# Patient Record
Sex: Male | Born: 1985 | Race: Asian | Hispanic: No | Marital: Married | Smoking: Former smoker
Health system: Southern US, Community
[De-identification: ages and names within clinical notes are randomized; demographics above are authoritative.]

## PROBLEM LIST (undated history)

## (undated) DIAGNOSIS — L709 Acne, unspecified: Secondary | ICD-10-CM

## (undated) HISTORY — DX: Acne, unspecified: L70.9

---

## 2009-06-12 HISTORY — PX: SOFT TISSUE CYST EXCISION: SHX2418

## 2009-07-09 ENCOUNTER — Emergency Department (HOSPITAL_COMMUNITY): Admission: EM | Admit: 2009-07-09 | Discharge: 2009-07-09 | Payer: Self-pay | Admitting: Family Medicine

## 2010-08-29 ENCOUNTER — Ambulatory Visit: Payer: Self-pay | Admitting: Family Medicine

## 2010-09-02 ENCOUNTER — Ambulatory Visit (INDEPENDENT_AMBULATORY_CARE_PROVIDER_SITE_OTHER): Payer: 59 | Admitting: Family Medicine

## 2010-09-02 DIAGNOSIS — L738 Other specified follicular disorders: Secondary | ICD-10-CM

## 2010-11-23 ENCOUNTER — Encounter: Payer: Self-pay | Admitting: Medical

## 2010-11-24 ENCOUNTER — Encounter: Payer: Self-pay | Admitting: Medical

## 2010-11-24 ENCOUNTER — Ambulatory Visit (INDEPENDENT_AMBULATORY_CARE_PROVIDER_SITE_OTHER): Payer: 59 | Admitting: Medical

## 2010-11-24 DIAGNOSIS — H1045 Other chronic allergic conjunctivitis: Secondary | ICD-10-CM

## 2010-11-24 DIAGNOSIS — L709 Acne, unspecified: Secondary | ICD-10-CM

## 2010-11-24 DIAGNOSIS — H1010 Acute atopic conjunctivitis, unspecified eye: Secondary | ICD-10-CM

## 2010-11-24 DIAGNOSIS — L910 Hypertrophic scar: Secondary | ICD-10-CM

## 2010-11-24 DIAGNOSIS — L708 Other acne: Secondary | ICD-10-CM

## 2010-11-24 DIAGNOSIS — Z0000 Encounter for general adult medical examination without abnormal findings: Secondary | ICD-10-CM

## 2010-11-24 MED ORDER — OLOPATADINE HCL 0.2 % OP SOLN: 0.2000 | Freq: Every day | OPHTHALMIC | Status: DC

## 2010-11-24 MED ORDER — CLINDAMYCIN PHOS-BENZOYL PEROX 1-5 % EX GEL: CUTANEOUS | Status: DC

## 2010-11-24 NOTE — Progress Notes (Signed)
Subjective:   HPI  Luis Watson is a 25 y.o. male who presents for complete physical.  Been in usual state of health.   Gets some exercise, works as a Network engineer at [employer], is married, has one child.  He does have a few c/o.  He is originally from [place], has lived here with his family for 2 years.  1) gets irritated, itchy and watery eyes, particularly if driving for long periods.  Hasn't tried any medication for this.   2) had a cyst or boil lanced last year at urgent care, and ever since has a hard skin area at the same place.  Wants to know about this. 3) he also has ongoing problem with acne on his back.  Had been given doxycycline in the past without relief.    No other c/o.  He notes Tdap booster through health dept last year.    History reviewed. No pertinent past medical history.  Past Surgical History  Procedure Date  . Soft tissue cyst excision 2011    chest   Family History  Problem Relation Age of Onset  . Cancer Mother     lung  . Asthma Father   . Diabetes Neg Hx   . Heart disease Neg Hx   . Hyperlipidemia Neg Hx   . Hypertension Neg Hx   . Stroke Neg Hx    No current outpatient prescriptions on file prior to visit.   No Known Allergies  Review of Systems Constitutional: +anorexia x 3-4 mo; denies fever, chills, sweats, unexpected weight change, fatigue Allergy: +itchy red eyes at times, particular if he has been driving long periods; denies recent sneezing, congestion Dermatology: +lesion on chest he is concerned about, acne on back and face.  denies changing moles, rash, lumps ENT: no runny nose, ear pain, sore throat, hoarseness, sinus pain, teeth pain, tinnitus, hearing loss, epistaxis Cardiology: denies chest pain, palpitations, edema, orthopnea, paroxysmal nocturnal dyspnea Respiratory: denies cough, shortness of breath, dyspnea on exertion, wheezing, hemoptysis Gastroenterology: denies abdominal pain, nausea, vomiting, diarrhea,  constipation, blood in stool, changes in bowel movement, dysphagia Hematology: denies bleeding or bruising problems Musculoskeletal: denies arthralgias, myalgias, joint swelling, back pain, neck pain, cramping, gait changes Ophthalmology: denies vision changes, eye redness, itching, discharge Urology: denies dysuria, difficulty urinating, hematuria, urinary frequency, urgency, incontinence Neurology: no headache, weakness, tingling, numbness, speech abnormality, memory loss, falls, dizziness Psychology: denies depressed mood, agitation, sleep problems     Objective:   Physical Exam  General appearance: alert, no distress, WD/WN, male, looks stated age Skin: back with scattered comedones and some scarring from prior comedones, upper middle chest with round flat lesion, c/w keloid, otherwise unremarkable HEENT: normocephalic, conjunctiva/corneas normal, sclerae anicteric, PERRLA, EOMi, nares patent, no discharge or erythema, pharynx normal Oral cavity: MMM, tongue normal, teeth normal Neck: supple, no lymphadenopathy, no thyromegaly, no masses, normal ROM, no bruits Chest: non tender, normal shape and expansion Heart: RRR, normal S1, S2, no murmurs Lungs: CTA bilaterally, no wheezes, rhonchi, or rales Abdomen: +bs, soft, non tender, non distended, no masses, no hepatomegaly, no splenomegaly, no bruits Back: non tender, normal ROM, no scoliosis Musculoskeletal: right volar base of thumb with linear 4cm surgical scar, upper extremities non tender, no obvious deformity, normal ROM throughout, lower extremities non tender, no obvious deformity, normal ROM throughout Extremities: no edema, no cyanosis, no clubbing Pulses: 2+ symmetric, upper and lower extremities, normal cap refill Neurological: alert, oriented x 3, CN2-12 intact, strength normal upper extremities and  lower extremities, sensation normal throughout, DTRs 2+ throughout, no cerebellar signs, gait normal Psychiatric: normal affect,  behavior normal, pleasant  GU: normal external genitalia, uncircumcised, nontender, no massses, no rash, no lymphadenopathy   Assessment :    Encounter Diagnoses  Name Primary?  . General medical examination Yes  . Keloid of skin   . Allergic conjunctivitis   . Acne      Plan:    Physical - discussed healthy lifestyle, diet, exercise, safety, preventative care.  Labs today (non fasting) and we will call with results.    Keloid - discussed this finding, gave handout on keloids.  Allergic conjunctiva - trial of Pataday.  Acne - he wants to try topical, thus, trial of gel Rx below.

## 2010-11-24 NOTE — Patient Instructions (Signed)
I will have you begin Clindamycin gel topically on your back twice daily.  Have your wife rub in on your entire back.  Wash hands after applying the gel.  I want you to try the eye drop once daily in the eyes as needed for irritated eyes.  Your skin lesion on the chest has formed a keloid.  I have provided a handout on this.    We will call you with lab results.

## 2010-11-25 LAB — TSH: TSH: 0.678 u[IU]/mL (ref 0.350–4.500)

## 2010-11-25 LAB — LIPID PANEL
LDL Cholesterol: 69 mg/dL (ref 0–99)
VLDL: 31 mg/dL (ref 0–40)

## 2010-11-25 LAB — CBC WITH DIFFERENTIAL/PLATELET
Basophils Absolute: 0 10*3/uL (ref 0.0–0.1)
Basophils Relative: 0 % (ref 0–1)
Eosinophils Relative: 2 % (ref 0–5)
HCT: 39.9 % (ref 39.0–52.0)
Hemoglobin: 13.9 g/dL (ref 13.0–17.0)
MCH: 28.7 pg (ref 26.0–34.0)
MCHC: 34.8 g/dL (ref 30.0–36.0)
MCV: 82.3 fL (ref 78.0–100.0)
Monocytes Absolute: 0.4 10*3/uL (ref 0.1–1.0)
Monocytes Relative: 8 % (ref 3–12)
RDW: 12.3 % (ref 11.5–15.5)

## 2010-11-25 LAB — COMPREHENSIVE METABOLIC PANEL
ALT: 14 U/L (ref 0–53)
AST: 19 U/L (ref 0–37)
Alkaline Phosphatase: 65 U/L (ref 39–117)
Chloride: 104 mEq/L (ref 96–112)
Creat: 1.06 mg/dL (ref 0.50–1.35)
Total Bilirubin: 0.5 mg/dL (ref 0.3–1.2)

## 2010-12-01 ENCOUNTER — Encounter: Payer: Self-pay | Admitting: *Deleted

## 2010-12-01 ENCOUNTER — Telehealth: Payer: Self-pay | Admitting: *Deleted

## 2010-12-01 ENCOUNTER — Other Ambulatory Visit: Payer: Self-pay | Admitting: *Deleted

## 2010-12-01 NOTE — Telephone Encounter (Addendum)
Message copied by Dorthula Perfect on Thu Dec 01, 2010 11:46 AM ------      Message from: Aleen Campi, DAVID S      Created: Mon Nov 28, 2010  8:00 AM       Did we get a urinalysis on him?  All his other labs are normal - liver, kidney, glucose, cholesterol, blood counts, thyroid, electrolytes.  Have him call in 45mo on his medications for dry eyes and acne and let us know if its helping.    Tried calling pt, phone disconnected.  Sent letter for pt to call to inform us how the medications are helping for dry eyes and acne.  Also sent copy of labs to pt with letter.  CM, LPN

## 2010-12-06 ENCOUNTER — Other Ambulatory Visit (INDEPENDENT_AMBULATORY_CARE_PROVIDER_SITE_OTHER): Payer: 59

## 2010-12-06 DIAGNOSIS — Z0000 Encounter for general adult medical examination without abnormal findings: Secondary | ICD-10-CM

## 2010-12-06 LAB — POCT URINALYSIS DIPSTICK
Blood, UA: NEGATIVE
Glucose, UA: NEGATIVE
Nitrite, UA: NEGATIVE
Protein, UA: NEGATIVE
Spec Grav, UA: 1.02
Urobilinogen, UA: NEGATIVE

## 2010-12-07 ENCOUNTER — Telehealth: Payer: Self-pay | Admitting: *Deleted

## 2010-12-07 NOTE — Telephone Encounter (Addendum)
Message copied by Dorthula Perfect on Wed Dec 07, 2010  9:28 AM ------      Message from: Aleen Campi, DAVID S      Created: Wed Dec 07, 2010  7:57 AM       Urine results are normal.    Pt notified of urine results.  CM, LPN

## 2011-09-06 ENCOUNTER — Encounter: Payer: 59 | Admitting: Medical

## 2011-09-18 ENCOUNTER — Encounter: Payer: 59 | Admitting: Medical

## 2011-10-06 ENCOUNTER — Ambulatory Visit (INDEPENDENT_AMBULATORY_CARE_PROVIDER_SITE_OTHER): Payer: 59 | Admitting: Medical

## 2011-10-06 ENCOUNTER — Encounter: Payer: Self-pay | Admitting: Medical

## 2011-10-06 VITALS — BP 92/60 | HR 78 | Temp 97.9°F | Resp 16 | Ht 63.0 in | Wt 116.0 lb

## 2011-10-06 DIAGNOSIS — L910 Hypertrophic scar: Secondary | ICD-10-CM

## 2011-10-06 MED ORDER — HYDROCORTISONE 2.5 % EX CREA: TOPICAL_CREAM | Freq: Two times a day (BID) | CUTANEOUS | Status: AC

## 2011-10-06 NOTE — Patient Instructions (Signed)
Epidermal Cyst An epidermal cyst is sometimes called a sebaceous cyst, epidermal inclusion cyst, or infundibular cyst. These cysts usually contain a substance that looks "pasty" or "cheesy" and may have a bad smell. This substance is a protein called keratin. Epidermal cysts are usually found on the face, neck, or trunk. They may also occur in the vaginal area or other parts of the genitalia of both men and women. Epidermal cysts are usually small, painless, slow-growing bumps or lumps that move freely under the skin. It is important not to try to pop them. This may cause an infection and lead to tenderness and swelling. CAUSES  Epidermal cysts may be caused by a deep penetrating injury to the skin or a plugged hair follicle, often associated with acne. SYMPTOMS  Epidermal cysts can become inflamed and cause:  Redness.   Tenderness.   Increased temperature of the skin over the bumps or lumps.   Grayish-white, bad smelling material that drains from the bump or lump.  DIAGNOSIS  Epidermal cysts are easily diagnosed by your caregiver during an exam. Rarely, a tissue sample (biopsy) may be taken to rule out other conditions that may resemble epidermal cysts. TREATMENT   Epidermal cysts often get better and disappear on their own. They are rarely ever cancerous.   If a cyst becomes infected, it may become inflamed and tender. This may require opening and draining the cyst. Treatment with antibiotics may be necessary. When the infection is gone, the cyst may be removed with minor surgery.   Small, inflamed cysts can often be treated with antibiotics or by injecting steroid medicines.   Sometimes, epidermal cysts become large and bothersome. If this happens, surgical removal in your caregiver's office may be necessary.  HOME CARE INSTRUCTIONS  Only take over-the-counter or prescription medicines as directed by your caregiver.   Take your antibiotics as directed. Finish them even if you start to  feel better.  SEEK MEDICAL CARE IF:   Your cyst becomes tender, red, or swollen.   Your condition is not improving or is getting worse.   You have any other questions or concerns.  MAKE SURE YOU:  Understand these instructions.   Will watch your condition.   Will get help right away if you are not doing well or get worse.  Document Released: 04/29/2004 Document Revised: 05/18/2011 Document Reviewed: 12/05/2010 [facility] Patient Information 2012 [place], [place].   Keloids Email this page to a friendShare on facebookShare on twitterBookmark & SharePrinter-friendly version  A keloids is a growth of extra scar tissue where the skin has healed after an injury.  Causes Keloids can form after skin injuries from: Acne  Burns  Chickenpox  Ear piercing  Minor scratches  Cuts from surgery or trauma  Vaccination sites The problem is more common in people ages 71 to 39, and in African Americans, Asians, and Hispanics. Keloids often run in families. Symptoms A keloid may be: Flesh-colored, red, or pink  Located over the site of a wound or injury  Lumpy (nodular) or ridged  Tender and itchy  Irritated from friction such as rubbing on clothing A keloid will tan darker than the skin around it if exposed to sun during the first year after it forms. The darker color may not go away. Exams and Tests Your doctor will look at your skin to see if you have a keloid. A skin biopsy may be done to rule out other types of skin growths (tumors). Treatment Keloids often do not need treatment.  If the keloid bothers you, the following things can be done to reduce the size: Corticosteroid injections  Freezing (cryotherapy)  Laser treatments  Radiation  Surgical removal  Silicone gel or patches Many of these treatments can cause a larger keloid scar to form. Outlook (Prognosis) Keloids usually are not harmful to your health but they may affect how you look. In some cases, they may become smaller,  flatter, and less noticeable over time. When to Contact a Medical Professional Call your health care provider if: You develop keloids and want to have them removed or reduced  You develop new symptoms

## 2011-10-06 NOTE — Progress Notes (Signed)
Subjective: He notes concern for bump on chest.  He notes in the past was seen by urgent care for cyst on chest.  This was cut open and drained.  Since then he has had a scar that is itchy.  He wants to know why he got both the cyst and the scar and why it doesn't go back to normal appearing skin.  It itches, particular if he gets hot.  No other c/o.  Objective: Gen: wd, wn Skin: upper middle chest with 1.5 x 1 cm squarish keloid  Assessment: Encounter Diagnosis  Name Primary?  . Keloid Yes    Plan hydrocortisone cream for itching, and will refer to plastic surgery if he desires.

## 2012-05-24 ENCOUNTER — Ambulatory Visit: Payer: 59 | Admitting: Medical

## 2012-05-27 ENCOUNTER — Ambulatory Visit: Payer: 59 | Admitting: Medical

## 2012-06-11 ENCOUNTER — Encounter: Payer: Self-pay | Admitting: Family Medicine

## 2012-07-31 ENCOUNTER — Ambulatory Visit
Admission: RE | Admit: 2012-07-31 | Discharge: 2012-07-31 | Disposition: A | Discharge status: Home / Self Care | Payer: 59 | Source: Ambulatory Visit | Attending: Medical | Admitting: Medical

## 2012-07-31 ENCOUNTER — Ambulatory Visit (INDEPENDENT_AMBULATORY_CARE_PROVIDER_SITE_OTHER): Payer: 59 | Admitting: Medical

## 2012-07-31 ENCOUNTER — Encounter: Payer: Self-pay | Admitting: Medical

## 2012-07-31 VITALS — BP 110/70 | HR 94 | Resp 20 | Wt 121.0 lb

## 2012-07-31 DIAGNOSIS — R079 Chest pain, unspecified: Secondary | ICD-10-CM

## 2012-07-31 MED ORDER — IBUPROFEN 600 MG PO TABS: 600.0000 mg | ORAL_TABLET | Freq: Three times a day (TID) | ORAL | Status: DC | PRN

## 2012-07-31 NOTE — Progress Notes (Signed)
  Subjective:    Luis Watson is a 27 y.o. male who presents for evaluation of chest pain. Onset was 2 days ago. Symptoms have been stable since that time. The patient describes the pain as sharp and does not radiate. Patient rates pain as a 7/10 in intensity. Associated symptoms are: none. Aggravating factors are: deep inspiration and moving right shoulder or turning torso makes it worse. Alleviating factors are: none. Patient's cardiac risk factors are: male gender. Patient's risk factors for DVT/PE: none. Previous cardiac testing: none. No prior similar.   The following portions of the patient's history were reviewed and updated as appropriate: allergies, current medications, past family history, past medical history, past social history, past surgical history and problem list.  Review of Systems Constitutional: +fever last Sunday, but none since, chills, sweats, unexpected weight change, anorexia, fatigue ENT: no runny nose, ear pain, sore throat, hoarseness, sinus pain, hearing loss, epistaxis Cardiology: denies palpitations, edema, orthopnea, paroxysmal nocturnal dyspnea Respiratory: +mild cough first thing in morning that resolves, shortness of breath, dyspnea on exertion, wheezing, hemoptysis Gastroenterology: denies abdominal pain, nausea, vomiting, diarrhea, constipation,  Hematology: denies bleeding or bruising problems Musculoskeletal: denies arthralgias, myalgias, joint swelling, back pain, neck pain, cramping, gait changes Urology: denies dysuria, difficulty urinating Neurology: no headache, weakness, tingling, numbness Psychology: denies depressed mood, agitation, sleep problems    Objective:     Filed Vitals:   07/31/12 1539  BP: 110/70  Pulse: 94  Resp: 20    General appearance: alert, no distress, WD/WN, male  HEENT: normocephalic, conjunctiva/corneas normal, sclerae anicteric, PERRLA, EOMi, nares patent, no discharge or erythema, pharynx normal Oral cavity:  MMM Neck: supple, no lymphadenopathy, no thyromegaly, no JVD, no bruits, no masses, normal ROM Chest: right superior anterior chest wall tenderness, otherwise non tender, normal shape and expansion Heart: RRR, normal S1, S2, no murmurs Lungs: CTA bilaterally, no wheezes, rhonchi, or rales, normal percussion  Abdomen: +bs, soft, non tender, non distended, no masses, no hepatomegaly, no splenomegaly, no bruits Back: non tender, normal ROM, no scoliosis Musculoskeletal: upper extremities non tender, no obvious deformity, normal ROM throughout Extremities: no edema, no cyanosis, no clubbing Pulses: 2+ symmetric, upper and lower extremities, normal cap refill   Assessment:   Encounter Diagnoses  Name Primary?  . Chest pain Yes  . Costochondritis   . Tobacco use disorder       Plan:  Pleuritic chest pain, chest wall tenderness.   Your symptoms suggest costochondritis or musculoskeletal chest pain.   I recommend you stretch you arms, shoulder, neck and upper body daily, exercise regularly such as walking or other exercise.  You can use Ibuprofen prescription 3 times daily for the next several days for chest pain.     I recommend you stop smoking due to health risks including cancer, heart disease, artery disease, and increased chance of lung infection.  Go for xray, but I really suspect this is chest wall pain and less likely anything to do with your lungs.  Gave handout on costochondritis, Ibuprofen script.

## 2012-07-31 NOTE — Patient Instructions (Addendum)
Your symptoms suggest costochondritis or musculoskeletal chest pain.   I recommend you stretch you arms, shoulder, neck and upper body daily, exercise regularly such as walking or other exercise.  You can use Ibuprofen prescription 3 times daily for the next several days for chest pain.     I recommend you stop smoking due to health risks including cancer, heart disease, artery disease, and increased chance of lung infection.  Go for xray, but I really suspect this is chest wall pain and less likely anything to do with your lungs.    Costochondritis Costochondritis (Tietze syndrome), or costochondral separation, is a swelling and irritation (inflammation) of the tissue (cartilage) that connects your ribs with your breastbone (sternum). It may occur on its own (spontaneously), through damage caused by an accident (trauma), or simply from coughing or minor exercise. It may take up to 6 weeks to get better and longer if you are unable to be conservative in your activities. HOME CARE INSTRUCTIONS   Avoid exhausting physical activity. Try not to strain your ribs during normal activity. This would include any activities using chest, belly (abdominal), and side muscles, especially if heavy weights are used.  Use ice for 15 to 20 minutes per hour while awake for the first 2 days. Place the ice in a plastic bag, and place a towel between the bag of ice and your skin.  Only take over-the-counter or prescription medicines for pain, discomfort, or fever as directed by your caregiver. SEEK IMMEDIATE MEDICAL CARE IF:   Your pain increases or you are very uncomfortable.  You have a fever.  You develop difficulty with your breathing.  You cough up blood.  You develop worse chest pains, shortness of breath, sweating, or vomiting.  You develop new, unexplained problems (symptoms). MAKE SURE YOU:   Understand these instructions.  Will watch your condition.  Will get help right away if you are not  doing well or get worse. Document Released: 03/08/2005 Document Revised: 08/21/2011 Document Reviewed: 01/15/2008 [facility] Patient Information 2013 [place], [place].

## 2012-08-01 NOTE — Progress Notes (Signed)
Quick Note:  CALLED PT ONLY NUMBER IN CHART AND LEFT MESSAGE WORD FOR WORD (   CXR is normal. Advised him to use Ibuprofen TID for the next few days. i sent this to pharmacy. If not improving in 1 wk, let me know. Recommend he stop smoking. )  ______

## 2013-04-04 ENCOUNTER — Encounter (HOSPITAL_COMMUNITY): Payer: Self-pay | Admitting: Emergency Medicine

## 2013-04-04 ENCOUNTER — Emergency Department (HOSPITAL_COMMUNITY)
Admission: EM | Admit: 2013-04-04 | Discharge: 2013-04-05 | Disposition: A | Discharge status: Home / Self Care | Payer: 59 | Attending: Emergency Medicine | Admitting: Emergency Medicine

## 2013-04-04 ENCOUNTER — Emergency Department (HOSPITAL_COMMUNITY): Payer: 59

## 2013-04-04 DIAGNOSIS — H9209 Otalgia, unspecified ear: Secondary | ICD-10-CM | POA: Insufficient documentation

## 2013-04-04 DIAGNOSIS — J029 Acute pharyngitis, unspecified: Secondary | ICD-10-CM | POA: Insufficient documentation

## 2013-04-04 DIAGNOSIS — F17200 Nicotine dependence, unspecified, uncomplicated: Secondary | ICD-10-CM | POA: Insufficient documentation

## 2013-04-04 DIAGNOSIS — M542 Cervicalgia: Secondary | ICD-10-CM | POA: Insufficient documentation

## 2013-04-04 DIAGNOSIS — Z791 Long term (current) use of non-steroidal anti-inflammatories (NSAID): Secondary | ICD-10-CM | POA: Insufficient documentation

## 2013-04-04 DIAGNOSIS — H9202 Otalgia, left ear: Secondary | ICD-10-CM

## 2013-04-04 DIAGNOSIS — R079 Chest pain, unspecified: Secondary | ICD-10-CM | POA: Insufficient documentation

## 2013-04-04 LAB — BASIC METABOLIC PANEL
BUN: 10 mg/dL (ref 6–23)
Calcium: 9.3 mg/dL (ref 8.4–10.5)
Creatinine, Ser: 0.86 mg/dL (ref 0.50–1.35)
GFR calc Af Amer: >90 mL/min (ref >90)
GFR calc non Af Amer: >90 mL/min (ref >90)
Glucose, Bld: 95 mg/dL (ref 70–99)

## 2013-04-04 LAB — CBC
HCT: 42.2 % (ref 39.0–52.0)
Hemoglobin: 15.6 g/dL (ref 13.0–17.0)
MCH: 30.2 pg (ref 26.0–34.0)
MCHC: 37 g/dL — ABNORMAL HIGH (ref 30.0–36.0)
MCV: 81.6 fL (ref 78.0–100.0)
RDW: 11.9 % (ref 11.5–15.5)

## 2013-04-04 MED ORDER — NAPROXEN 250 MG PO TABS
500.0000 mg | ORAL_TABLET | Freq: Once | ORAL | Status: AC
  Administered 2013-04-04: 500 mg via ORAL
  Filled 2013-04-04: qty 2

## 2013-04-04 MED ORDER — NAPROXEN 500 MG PO TABS: 500.0000 mg | ORAL_TABLET | Freq: Two times a day (BID) | ORAL | Status: DC

## 2013-04-04 NOTE — ED Notes (Addendum)
C/o burning in center of chest x 3 days with intermittent sob.  Also reports L ear pain and neck pain since this afternoon.  Denies nausea and vomiting.  Chest pain worse with movement.

## 2013-04-04 NOTE — ED Provider Notes (Signed)
I saw and evaluated the patient, reviewed the resident's note and I agree with the findings and plan. If applicable, I agree with the resident's interpretation of the EKG.  If applicable, I was present for critical portions of any procedures performed.  2 days of L ear pain, anterior chest pain, worse with movement.  No fever.  No meningismus.  CTAB, RRR. No exudates.  EKG nsr.   Atypical for ACS or PE.   Glynn Octave, MD 04/04/13 [phone]

## 2013-04-04 NOTE — ED Provider Notes (Signed)
CSN: [id number]     Arrival date & time 04/04/13  2129 History   First MD Initiated Contact with Patient 04/04/13 2157     Chief Complaint  Patient presents with  . Otalgia  . Chest Pain   (Consider location/radiation/quality/duration/timing/severity/associated sxs/prior Treatment) HPI Onset was yesterday for chest pain and today for left side neck pain and ear pain.  The pain is mild. Chest pain described as sharp, located to central chest worse with movement. Left side neck and ear pain described as mild, worse with swallowing.  Associated symptoms: sore throat, no hearing loss, no fever, no cough.  Recent medical care: none.   History reviewed. No pertinent past medical history. Past Surgical History  Procedure Laterality Date  . Soft tissue cyst excision  2011    chest   Family History  Problem Relation Age of Onset  . Cancer Mother     lung  . Asthma Father   . Diabetes Neg Hx   . Heart disease Neg Hx   . Hyperlipidemia Neg Hx   . Hypertension Neg Hx   . Stroke Neg Hx    History  Substance Use Topics  . Smoking status: Current Some Day Smoker -- 0.25 packs/day for 2 years  . Smokeless tobacco: Never Used  . Alcohol Use: 0.5 oz/week    1 drink(s) per week    Review of Systems Constitutional: Negative for fever.  Eyes: Negative for vision loss.  ENT: Negative for difficulty swallowing.  Cardiovascular: Negative for chest pain. Respiratory: Negative for respiratory distress.  Gastrointestinal:  Negative for vomiting.  Genitourinary: Negative for inability to void.  Musculoskeletal: Negative for gait problem.  Integumentary: Negative for rash.  Neurological: Negative for new focal weakness.     Allergies  Review of patient's allergies indicates no known allergies.  Home Medications   Current Outpatient Rx  Name  Route  Sig  Dispense  Refill  . naproxen (NAPROSYN) 500 MG tablet   Oral   Take 1 tablet (500 mg total) by mouth 2 (two) times daily.   30  tablet   0    BP 110/77  Pulse 80  Temp(Src) 98.2 F (36.8 C) (Oral)  Resp 16  Wt 128 lb 1 oz (58.089 kg)  BMI 22.69 kg/m2  SpO2 100% Physical Exam Nursing note and vitals reviewed.  Constitutional: Pt is alert and appears stated age. Eyes: No injection, no scleral icterus. HENT: Atraumatic, airway open without erythema or exudate. Bilateral TMs normal. Uvula midline.  Neck: Soft, supple, FROM.  Respiratory: No respiratory distress. Equal breathing bilaterally. Cardiovascular: Normal rate. Extremities warm and well perfused.  Chest: Anterior chest tender to palpation.  Abdomen: Soft, non-tender. MSK: Extremities are atraumatic without deformity. Skin: No rash, no wounds.   Neuro: No motor nor sensory deficit.     ED Course  Procedures (including critical care time) Labs Review Labs Reviewed  CBC - Abnormal; Notable for the following:    MCHC 37.0 (*)    All other components within normal limits  BASIC METABOLIC PANEL  POCT I-STAT TROPONIN I   Imaging Review Dg Chest 2 View  04/04/2013   CLINICAL DATA:  Chest pain, palpitation  EXAM: CHEST  2 VIEW  COMPARISON:  07/31/2012  FINDINGS: The heart size and mediastinal contours are within normal limits. Both lungs are clear. The visualized skeletal structures are unremarkable.  IMPRESSION: No active cardiopulmonary disease.   Electronically Signed   By: Esperanza Heir M.D.   On: 04/04/2013 22:04  EKG Interpretation     Ventricular Rate:  70 PR Interval:  134 QRS Duration: 80 QT Interval:  382 QTC Calculation: 412 R Axis:   85 Text Interpretation:  Normal sinus rhythm Normal ECG No previous ECGs available            MDM   1. Chest pain   2. Neck pain   3. Ear pain, left    27 y.o. male w/ PMHx of tobacco use presents w/ chest pain and left side neck, ear pain, sore throat. Chest pain c/w MSK pain. Not ACS, PE or dissection. EKG, CXR normal. Labs in triage normal. Likely viral etiology of other  symptoms. Pt looks great. Not c/w meningitis, no localized bacterial infection. No trauma. Pt given motrin, plan for pcp follow up. Counseling provided regarding diagnosis, treatment plan, follow up recommendations, and return precautions. Questions answer       I independently viewed, interpreted, and used in my medical decision making all ordered lab and imaging tests. Medical Decision Making discussed with ED attending Glynn Octave, MD      Charm Barges, MD 04/04/13 2325

## 2013-04-16 ENCOUNTER — Encounter: Payer: Self-pay | Admitting: Medical

## 2013-04-16 ENCOUNTER — Ambulatory Visit (INDEPENDENT_AMBULATORY_CARE_PROVIDER_SITE_OTHER): Payer: 59 | Admitting: Medical

## 2013-04-16 VITALS — BP 98/60 | HR 68 | Temp 98.2°F | Resp 16 | Wt 127.0 lb

## 2013-04-16 DIAGNOSIS — S61209A Unspecified open wound of unspecified finger without damage to nail, initial encounter: Secondary | ICD-10-CM

## 2013-04-16 DIAGNOSIS — B079 Viral wart, unspecified: Secondary | ICD-10-CM

## 2013-04-16 DIAGNOSIS — R05 Cough: Secondary | ICD-10-CM

## 2013-04-16 DIAGNOSIS — S61219A Laceration without foreign body of unspecified finger without damage to nail, initial encounter: Secondary | ICD-10-CM

## 2013-04-16 NOTE — Progress Notes (Signed)
Subjective:  Luis Watson is a 27 y.o. male who presents for several concerns.  He notes few day hx/o cough, mainly at night time.  Worse when lying.   Not coughing much in the day.  Doesn't feel sick.  No allergy concerns.   He does eat spicy foods.  Denies ear pain, fever, sinus pressure, sneezing, runny nose.   He does have mild sore throat, some epigastric pain.  Using nothing for symptoms.  He cut left index finger last night 6pm cutting vegetables.  Has small laceration little flap of skin.  Cleaned the wound with water.   Still bleeding though.  He c/o lesion on right thumb beside nail x 3 mo.  Cut some of it off with nail clippers, but it is staying a little tender.  Not changing, not better or worse.    No other aggravating or relieving factors.  No other c/o.  ROS as in subjective.    Objective: Filed Vitals:   04/16/13 1216  BP: 98/60  Pulse: 68  Temp: 98.2 F (36.8 C)  Resp: 16    General appearance: Alert, WD/WN, no distress                             Skin: left index finger at PIP dorsal with small 1cm flap like avulsion laceration, bleeding a little, but not gaping open.  No signs of infection,no erythema, no pus, no induration or warmth.   Right medial border of nailbed with round raised verrucal appearing 3mm lesion suggestive of wart.                            Head: no sinus tenderness                            Eyes: conjunctiva normal, corneas clear, PERRLA                            Ears: pearly TMs, external ear canals normal                          Nose: septum midline, turbinates swollen, with erythema and clear discharge             Mouth/throat: MMM, tongue normal, mild pharyngeal erythema                           Neck: supple, no adenopathy, no thyromegaly, nontender                          Heart: RRR, normal S1, S2, no murmurs                         Lungs: CTA bilaterally, no wheezes, rales, or rhonchi     Assessment: Encounter Diagnoses  Name  Primary?  . Cough Yes  . Laceration of finger, initial encounter   . Wart      Plan: Cough - likely due to GERD.  Begin samples of Nexium, cut back on spicy and acidic foods, recheck 2wk.  Laceration of finger - had patient irritated wound with tap water for several minutes.   Used steri strip to loosely close wound.  Advised he keep wound clean and dry, change dressing 1-2 times daily.  Gradually should heal.  Use caution not to reopen wound.  advised if signs of infection, return.   Last tetanus 2010 per patient.   Advised that no suture could be placed given wound >17 hours old.  Wart - discussed diagnosis, options for therapy.  Discussed risks/benefits of cryotherapy which he consents to.  Used Veruccafreeze to treat lesion.  Discussed wound care, signs of infection that would prompt recheck.  otherwise f/u 2-3wk.

## 2013-04-16 NOTE — Patient Instructions (Signed)
Cough - likely related to acid reflux.    Begin Nexium 24 hour tablet, 30-45 minutes prior to breakfast daily.   Cut back on spicy and acidic foods for now.  Don't eat within 2 hours of bedtime.  Wart - right thumb.  We used cryotherapy today to treat the wart.   If it blisters up in a few days, use OTC neosporin or triple antibiotic ointment twice daily, cover with bandaid.   The wart area should gradually slough off.   If not looking back to normal in 2-3 weeks, then you can either try OTC Compound W/wart removal or return for another treatment  Left index finger laceration.   Because the wound is over 12 hours old, we can't put a suture in there.  Thus, we put a steri strip on the wound to keep it closed.   As long as the steri strip is in place, leave this alone, and keep it covered with bandaid or gauze for the next several days.   The steri strip will wear off over the next week or so.     If either wound looks red, warm, draining pus, fever, then return right away.

## 2013-07-21 ENCOUNTER — Encounter: Payer: Self-pay | Admitting: Medical

## 2013-07-21 ENCOUNTER — Ambulatory Visit (INDEPENDENT_AMBULATORY_CARE_PROVIDER_SITE_OTHER): Payer: 59 | Admitting: Medical

## 2013-07-21 VITALS — BP 92/68 | HR 62 | Temp 98.0°F | Resp 16 | Wt 123.0 lb

## 2013-07-21 DIAGNOSIS — H6690 Otitis media, unspecified, unspecified ear: Secondary | ICD-10-CM

## 2013-07-21 DIAGNOSIS — J029 Acute pharyngitis, unspecified: Secondary | ICD-10-CM

## 2013-07-21 DIAGNOSIS — J209 Acute bronchitis, unspecified: Secondary | ICD-10-CM

## 2013-07-21 DIAGNOSIS — B079 Viral wart, unspecified: Secondary | ICD-10-CM

## 2013-07-21 MED ORDER — HYDROCODONE-HOMATROPINE 5-1.5 MG/5ML PO SYRP: 5.0000 mL | ORAL_SOLUTION | Freq: Three times a day (TID) | ORAL | Status: DC | PRN

## 2013-07-21 MED ORDER — AMOXICILLIN 875 MG PO TABS
875.0000 mg | ORAL_TABLET | Freq: Two times a day (BID) | ORAL | Status: DC
Start: 2013-07-21 — End: 2015-03-08

## 2013-07-21 NOTE — Progress Notes (Signed)
Subjective:  Luis Watson is a 28 y.o. male who presents for illness and skin lesion.  Symptoms include several days of head pressure, sore throat, ear pain, burning in chest, cough that is deep, yellow phlegm consistently, fever and body aches.  Denies SOB, wheezing, NVD.   Past history is significant for smoker.  Using Ibuprofen for symptoms.  Denies sick contacts.  No other aggravating or relieving factors.    He is here for wart of right thumb.   Saw me for this last year, and it seemed to resolve with verruca freeze but now seems to be growing back.   No other c/o.  ROS as in subjective   Objective: Filed Vitals:   07/21/13 1050  BP: 92/68  Pulse: 62  Temp: 98 F (36.7 C)  Resp: 16    General appearance: Alert, WD/WN, no distress                             Skin: warm, no rash                           Head: +mild maxillary sinus tenderness,                            Eyes: conjunctiva normal, corneas clear, PERRLA                            Ears: left TM with mild erythema, bilat flat TMs, external ear canals normal                          Nose: septum midline, turbinates swollen, with erythema and clear discharge             Mouth/throat: MMM, tongue normal, mild pharyngeal erythema                           Neck: supple, no adenopathy, no thyromegaly, nontender                          Heart: RRR, normal S1, S2, no murmurs                         Lungs: CTA bilaterally, no wheezes, rales, or rhonchi   Skin: right thumb medial nail bed border with 2mm round thicker area of skin where formerly was raised verruca lesion   Assessment and Plan:   Encounter Diagnoses  Name Primary?  . Acute bronchitis Yes  . Acute pharyngitis   . Otitis media   . Viral wart    Prescription given for Amoxicillin, Hycodan syrup prn, can use OTC Robitussin DM for congestion.  Tylenol or Ibuprofen OTC for fever and malaise.  Discussed symptomatic relief, nasal saline flush, and call or return  if worse or not improving in 2-3 days.     Skin lesion - discussed risks,benefits of treatment.  At his request, used Verruca freeze therapy to the verrucal lesion, 1 application with 45 second thaw time.  Discussed wound care, and recheck if not totally resolving.   Return if symptoms worsen or fail to improve.

## 2013-07-21 NOTE — Patient Instructions (Signed)
Begin Amoxicillin twice daily for ear and throat infection  Begin OTC Robitussin DM for cough and congestion.  At bedtime you can use Hycodan syrup for worse cough  Rest, drink plenty of water, use salt water gargles and warm fluids for throat pain  Use Ibuprofen OTC, 3 tablets 3 times daily for the next few days for throat pain and chest pain.

## 2014-11-05 ENCOUNTER — Institutional Professional Consult (permissible substitution): Payer: Self-pay | Admitting: Pulmonary Disease

## 2015-03-08 ENCOUNTER — Ambulatory Visit (INDEPENDENT_AMBULATORY_CARE_PROVIDER_SITE_OTHER): Payer: 59 | Admitting: Family Medicine

## 2015-03-08 ENCOUNTER — Encounter: Payer: Self-pay | Admitting: Family Medicine

## 2015-03-08 VITALS — BP 110/60 | HR 72 | Wt 124.0 lb

## 2015-03-08 DIAGNOSIS — L910 Hypertrophic scar: Secondary | ICD-10-CM | POA: Diagnosis not present

## 2015-03-08 DIAGNOSIS — J029 Acute pharyngitis, unspecified: Secondary | ICD-10-CM

## 2015-03-08 NOTE — Progress Notes (Signed)
   Subjective:    Patient ID: Luis Watson, male    DOB: [birth year], 29 y.o.   MRN: [id number]  HPI He has a scar on his anterior chest is causing some slight itching. He would like it removed. He also complains of a slight sore throat and left earache.   Review of Systems     Objective:   Physical Exam Alert and in no distress. Tympanic membranes and canals are normal. Pharyngeal area is normal. Neck is supple without adenopathy or thyromegaly. Cardiac exam shows a regular sinus rhythm without murmurs or gallops. Lungs are clear to auscultation. A pigmented 1 x 2.5 cm firm lesion noted on the upper anterior chest.       Assessment & Plan:  Keloid of skin  Sore throat I explained that it was a keloid and removing this could only end up causing a larger scar. I then injected a proximal we 20 mg of Kenalog in and around the keloid as it was hard to injected directly into it. hopefully this will help shrink it. Supportive care for the sore throat.

## 2015-10-12 ENCOUNTER — Encounter: Payer: Self-pay | Admitting: Medical

## 2015-10-12 ENCOUNTER — Ambulatory Visit (INDEPENDENT_AMBULATORY_CARE_PROVIDER_SITE_OTHER): Payer: BLUE CROSS/BLUE SHIELD | Admitting: Medical

## 2015-10-12 VITALS — BP 110/70 | HR 72 | Temp 97.8°F | Resp 16 | Wt 117.0 lb

## 2015-10-12 DIAGNOSIS — R059 Cough, unspecified: Secondary | ICD-10-CM

## 2015-10-12 DIAGNOSIS — H9202 Otalgia, left ear: Secondary | ICD-10-CM

## 2015-10-12 DIAGNOSIS — J988 Other specified respiratory disorders: Secondary | ICD-10-CM | POA: Diagnosis not present

## 2015-10-12 DIAGNOSIS — R05 Cough: Secondary | ICD-10-CM | POA: Diagnosis not present

## 2015-10-12 MED ORDER — AMOXICILLIN 875 MG PO TABS: 875.0000 mg | ORAL_TABLET | Freq: Two times a day (BID) | ORAL | Status: DC

## 2015-10-12 MED ORDER — PROMETHAZINE-DM 6.25-15 MG/5ML PO SYRP: 5.0000 mL | ORAL_SOLUTION | Freq: Four times a day (QID) | ORAL | Status: DC | PRN

## 2015-10-12 NOTE — Patient Instructions (Signed)
Encounter Diagnoses  Name Primary?  Marland Kitchen. Respiratory tract infection Yes  . Otalgia of left ear   . Cough     Recommendations:   Rest  Hydrate well with water  Begin Amoxicillin antibiotic twice daily for 10 days  Begin Promethazine DM for cough and congestion  You can continue Ibuprofen for pain, fever  If worse or not much improved by the end of the week, the call back

## 2015-10-12 NOTE — Progress Notes (Signed)
Subjective: Chief Complaint  Patient presents with  . Cough    producing yellow phlegm. states chest and ear hurts. started two days ago   Here for several day hx/o cough, chest burning, left ear hurting, coughing up yellow phlegm, fever, has sore throat.  No headache or sinus pain.   No NVD, but has some dizziness.   Feels some SOB.  No sick contacts.  Using some ibuprofen.   Nonsmoker.  No other aggravating or relieving factors. No other complaint.  No past medical history on file.   ROS as in subjective   Objective: BP 110/70 mmHg  Pulse 72  Temp(Src) 97.8 F (36.6 C) (Tympanic)  Resp 16  Wt 117 lb (53.071 kg)  SpO2 99%  General appearance: Alert, WD/WN, no distress                             Skin: warm, no rash                           Head: no sinus tenderness,                            Eyes: conjunctiva normal, corneas clear, PERRLA                            Ears: erythema mildly of left TM, right ear canal and TM pearly, external ear canals normal                          Nose: septum midline, turbinates swollen, with erythema and no discharge             Mouth/throat: MMM, tongue normal, mild pharyngeal erythema                           Neck: supple, no adenopathy, no thyromegaly, non tender                          Heart: RRR, normal S1, S2, no murmurs                         Lungs: few faint wheezes, but no asymmetry, no rales, or rhonchi       Assessment: Encounter Diagnoses  Name Primary?  Marland Kitchen. Respiratory tract infection Yes  . Otalgia of left ear   . Cough     Plan: advised good hydration, rest, can use Ibuprofen OTC for pain, fever, and begin medications below.  Call if worse or not improving within the next 4-5 days.    Return soon for physical  Ahmod was seen today for cough.  Diagnoses and all orders for this visit:  Respiratory tract infection  Otalgia of left ear  Cough  Other orders -     promethazine-dextromethorphan (PROMETHAZINE-DM)  6.25-15 MG/5ML syrup; Take 5 mLs by mouth 4 (four) times daily as needed for cough. -     amoxicillin (AMOXIL) 875 MG tablet; Take 1 tablet (875 mg total) by mouth 2 (two) times daily.

## 2015-11-12 ENCOUNTER — Ambulatory Visit (INDEPENDENT_AMBULATORY_CARE_PROVIDER_SITE_OTHER): Payer: BLUE CROSS/BLUE SHIELD | Admitting: Medical

## 2015-11-12 ENCOUNTER — Encounter: Payer: Self-pay | Admitting: Medical

## 2015-11-12 ENCOUNTER — Other Ambulatory Visit: Payer: Self-pay

## 2015-11-12 VITALS — BP 110/72 | HR 74 | Ht 63.25 in | Wt 118.0 lb

## 2015-11-12 DIAGNOSIS — H6690 Otitis media, unspecified, unspecified ear: Secondary | ICD-10-CM

## 2015-11-12 DIAGNOSIS — Z0000 Encounter for general adult medical examination without abnormal findings: Secondary | ICD-10-CM

## 2015-11-12 DIAGNOSIS — Z139 Encounter for screening, unspecified: Secondary | ICD-10-CM | POA: Diagnosis not present

## 2015-11-12 LAB — BASIC METABOLIC PANEL
BUN: 15 mg/dL (ref 7–25)
CALCIUM: 9.5 mg/dL (ref 8.6–10.3)
CO2: 26 mmol/L (ref 20–31)
Chloride: 102 mmol/L (ref 98–110)
Creat: 1.04 mg/dL (ref 0.60–1.35)
Glucose, Bld: 64 mg/dL — ABNORMAL LOW (ref 65–99)
Potassium: 4 mmol/L (ref 3.5–5.3)
SODIUM: 138 mmol/L (ref 135–146)

## 2015-11-12 LAB — CBC
HEMATOCRIT: 45.4 % (ref 38.5–50.0)
HEMOGLOBIN: 15.3 g/dL (ref 13.2–17.1)
MCH: 28.4 pg (ref 27.0–33.0)
MCHC: 33.7 g/dL (ref 32.0–36.0)
MCV: 84.4 fL (ref 80.0–100.0)
MPV: 11.7 fL (ref 7.5–12.5)
Platelets: 181 10*3/uL (ref 140–400)
RBC: 5.38 MIL/uL (ref 4.20–5.80)
RDW: 13.4 % (ref 11.0–15.0)
WBC: 4.6 10*3/uL (ref 4.0–10.5)

## 2015-11-12 LAB — LIPID PANEL
CHOL/HDL RATIO: 2.9 ratio (ref <=5.0)
Cholesterol: 188 mg/dL (ref 125–200)
HDL: 64 mg/dL (ref >=40)
LDL CALC: 106 mg/dL (ref <130)
Triglycerides: 90 mg/dL (ref <150)
VLDL: 18 mg/dL (ref <30)

## 2015-11-12 MED ORDER — CIPROFLOXACIN-HYDROCORTISONE 0.2-1 % OT SUSP: 3.0000 [drp] | Freq: Two times a day (BID) | OTIC | Status: DC

## 2015-11-12 NOTE — Addendum Note (Signed)
Addended by: Kieth BrightlyLAWSON, Brighten Orndoff M on: 11/12/2015 11:44 AM   Modules accepted: Orders, SmartSet

## 2015-11-12 NOTE — Progress Notes (Signed)
Subjective:   HPI  Luis Watson is a 30 y.o. male who presents for complete physical.  Been in usual state of health.   Gets some exercise, works as a Network engineer at [employer], is married, has one child.   He is originally from [place].  Has had some intermittent pain in left ear of late, has hx/o ear infection  No other c/o  Past Medical History  Diagnosis Date  . Acne     Past Surgical History  Procedure Laterality Date  . Soft tissue cyst excision  2011    chest    Social History   Social History  . Marital Status: Married    Spouse Name: N/A  . Number of Children: N/A  . Years of Education: N/A   Occupational History  . Not on file.   Social History Main Topics  . Smoking status: Former Smoker -- 0.25 packs/day for 2 years  . Smokeless tobacco: Never Used  . Alcohol Use: 0.6 oz/week    1 Standard drinks or equivalent per week     Comment: occasional  . Drug Use: No  . 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      Family History  Problem Relation Age of Onset  . Cancer Mother     lung  . Asthma Father   . Diabetes Neg Hx   . Heart disease Neg Hx   . Hyperlipidemia Neg Hx   . Hypertension Neg Hx   . Stroke Neg Hx      Current outpatient prescriptions:  .  ciprofloxacin-hydrocortisone (CIPRO HC) otic suspension, Place 3 drops into the left ear 2 (two) times daily., Disp: 10 mL, Rfl: 1  No Known Allergies    Review of Systems Constitutional: denies fever, chills, sweats, unexpected weight change, fatigue Allergy: none Dermatology: -acne on back and face.  denies changing moles, rash, lumps ENT: no runny nose, ear pain, sore throat, hoarseness, sinus pain, teeth pain, tinnitus, hearing loss,  epistaxis Cardiology: denies chest pain, palpitations, edema, orthopnea, paroxysmal nocturnal dyspnea Respiratory: denies cough, shortness of breath, dyspnea on exertion, wheezing, hemoptysis Gastroenterology: denies abdominal pain, nausea, vomiting, diarrhea, constipation, blood in stool, changes in bowel movement, dysphagia Hematology: denies bleeding or bruising problems Musculoskeletal: denies arthralgias, myalgias, joint swelling, back pain, neck pain, cramping, gait changes Ophthalmology: denies vision changes, eye redness, itching, discharge Urology: denies dysuria, difficulty urinating, hematuria, urinary frequency, urgency, incontinence Neurology: no headache, weakness, tingling, numbness, speech abnormality, memory loss, falls, dizziness Psychology: denies depressed mood, agitation, sleep problems     Objective:   Physical Exam BP 110/72 mmHg  Pulse 74  Ht 5' 3.25" (1.607 m)  Wt 118 lb (53.524 kg)  BMI 20.73 kg/m2  General appearance: alert, no distress, WD/WN, male, looks stated age Skin: back with scattered comedones and some scarring from prior comedones, upper middle chest with round flat lesion, c/w keloid, otherwise unremarkable HEENT: normocephalic, conjunctiva/corneas normal, sclerae anicteric, PERRLA, EOMi, nares patent, no discharge or erythema, pharynx normal Oral cavity: MMM, tongue normal, teeth normal Neck: supple, no lymphadenopathy, no thyromegaly, no masses, normal ROM, no bruits Chest: non tender, normal shape and expansion Heart: RRR, normal S1, S2, no murmurs Lungs:  CTA bilaterally, no wheezes, rhonchi, or rales Abdomen: +bs, soft, non tender, non distended, no masses, no hepatomegaly, no splenomegaly, no bruits Back: non tender, normal ROM, no scoliosis Musculoskeletal: right volar base of thumb with linear 4cm surgical scar, upper extremities non tender, no obvious deformity, normal ROM throughout, lower extremities non tender, no obvious deformity,  normal ROM throughout Extremities: no edema, no cyanosis, no clubbing Pulses: 2+ symmetric, upper and lower extremities, normal cap refill Neurological: alert, oriented x 3, CN2-12 intact, strength normal upper extremities and lower extremities, sensation normal throughout, DTRs 2+ throughout, no cerebellar signs, gait normal Psychiatric: normal affect, behavior normal, pleasant  GU: normal external genitalia, uncircumcised, nontender, no mass, no rash, no lymphadenopathy   Assessment :    Encounter Diagnoses  Name Primary?  . Encounter for health maintenance examination in adult Yes  . Ear infection   . Screening for condition      Plan:    Physical - discussed healthy lifestyle, diet, exercise, safety, preventative care.  Labs today He will get us copy of his immunizations He wants ABO blood type test   Ear infection - mild, script for drops today discussed testicular monthly self exams advised he see dentist for hygiene visit

## 2015-11-13 LAB — ABO AND RH: Rh Type: POSITIVE

## 2015-11-17 ENCOUNTER — Telehealth: Payer: Self-pay | Admitting: Medical

## 2015-11-17 MED ORDER — NEOMYCIN-POLYMYXIN-HC 1 % OT SOLN: 4.0000 [drp] | Freq: Three times a day (TID) | OTIC | Status: DC

## 2015-11-17 NOTE — Telephone Encounter (Signed)
Pt notified, went thru for $10

## 2015-11-17 NOTE — Telephone Encounter (Signed)
P.A. Gibson Rampenied, pt needs trial of Cortisporin otic solution or suspension ok to switch per Vincenza HewsShane.  Medication was called into pharmacy

## 2015-12-23 ENCOUNTER — Telehealth: Payer: Self-pay

## 2015-12-23 NOTE — Telephone Encounter (Signed)
Labs, OV notes placed in your folder for review. Trixie Rude/RLB

## 2015-12-27 ENCOUNTER — Encounter: Payer: Self-pay | Admitting: Medical

## 2016-03-22 ENCOUNTER — Ambulatory Visit (INDEPENDENT_AMBULATORY_CARE_PROVIDER_SITE_OTHER): Payer: BLUE CROSS/BLUE SHIELD | Admitting: Medical

## 2016-03-22 ENCOUNTER — Encounter: Payer: Self-pay | Admitting: Medical

## 2016-03-22 ENCOUNTER — Encounter: Payer: Self-pay | Admitting: *Deleted

## 2016-03-22 VITALS — BP 106/70 | HR 97 | Temp 98.2°F | Ht 63.25 in | Wt 123.0 lb

## 2016-03-22 DIAGNOSIS — H6690 Otitis media, unspecified, unspecified ear: Secondary | ICD-10-CM | POA: Diagnosis not present

## 2016-03-22 DIAGNOSIS — H9202 Otalgia, left ear: Secondary | ICD-10-CM | POA: Diagnosis not present

## 2016-03-22 DIAGNOSIS — Z23 Encounter for immunization: Secondary | ICD-10-CM | POA: Diagnosis not present

## 2016-03-22 MED ORDER — AMOXICILLIN 500 MG PO TABS: ORAL_TABLET | ORAL | 0 refills | Status: DC

## 2016-03-22 MED ORDER — PSEUDOEPHEDRINE HCL ER 120 MG PO TB12: 120.0000 mg | ORAL_TABLET | Freq: Two times a day (BID) | ORAL | 0 refills | Status: DC

## 2016-03-22 NOTE — Progress Notes (Signed)
Subjective: Chief Complaint  Patient presents with  . Ear Pain    left ear    Here for left ear discomfort.   Having left ear pain last few days, intense itching but no drainage. He did put a qtip in the other day, got slight bit of bloody drainage.   No runny nose, no sneezing, no sore throat, no cough, no fever.  No other aggravating or relieving factors. No other complaint.   Past Medical History:  Diagnosis Date  . Acne    No current outpatient prescriptions on file prior to visit.   No current facility-administered medications on file prior to visit.    ROS as in subjective   Objective: BP 106/70   Pulse 97   Temp 98.2 F (36.8 C) (Oral)   Ht 5' 3.25" (1.607 m)   Wt 123 lb (55.8 kg)   SpO2 98%   BMI 21.62 kg/m   General appearance: Alert, WD/WN, no distress, mildly ill appearing                             Skin: warm, no rash                           Head: no sinus tenderness                            Eyes: conjunctiva normal, corneas clear, PERRLA                            Ears: right TM pearly, left TM with erythema, reduced light reflex, bulging, otherwise no blood, no drainage, external ear canals normal                          Nose: septum midline, turbinates swollen, with erythema and clear discharge             Mouth/throat: MMM, tongue normal, mild pharyngeal erythema                           Neck: supple, no adenopathy, no thyromegaly, non tender                           Lungs: CTA bilaterally, no wheezes, rales, or rhonchi       Assessment: Encounter Diagnoses  Name Primary?  . Acute otitis media, unspecified otitis media type Yes  . Ear pain, left   . Need for prophylactic vaccination and inoculation against influenza     Plan: Begin medications below, hydrate well. If not completely back to normal in a week, let me know.  Of note , he has been averaging 1 ear infection a years.  discussed possible need to see ENT in the future if more  frequent recurrence.  He does have a 29yo at home  Counseled on the influenza virus vaccine.  Vaccine information sheet given.  Influenza vaccine given after consent obtained.  Luis Watson was seen today for ear pain.  Diagnoses and all orders for this visit:  Acute otitis media, unspecified otitis media type  Ear pain, left  Need for prophylactic vaccination and inoculation against influenza  Other orders -     amoxicillin (AMOXIL) 500 MG tablet; 2  tablets po BID x 10 days -     pseudoephedrine (SUDAFED 12 HOUR) 120 MG 12 hr tablet; Take 1 tablet (120 mg total) by mouth 2 (two) times daily.

## 2016-04-05 ENCOUNTER — Telehealth: Payer: Self-pay | Admitting: Medical

## 2016-04-05 NOTE — Telephone Encounter (Signed)
Pt called and states that his left ear is still hurting, he finished his antibiotic and is still having pain and clogged up, wants to know if you will send him in another antibiotic, states he is still not feeling any better pt uses [phone] - [place], [place] - [address] [facility] [address] also states that you would send him to a ENT if he did not get any better please advise

## 2016-04-06 ENCOUNTER — Other Ambulatory Visit: Payer: Self-pay | Admitting: Medical

## 2016-04-06 DIAGNOSIS — Z8669 Personal history of other diseases of the nervous system and sense organs: Secondary | ICD-10-CM

## 2016-04-06 MED ORDER — CIPROFLOXACIN-DEXAMETHASONE 0.3-0.1 % OT SUSP: 4.0000 [drp] | Freq: Two times a day (BID) | OTIC | 0 refills | Status: DC

## 2016-04-06 NOTE — Telephone Encounter (Signed)
I sent ear drop to pharmacy.   Go ahead and refer to ENT for chronic ear infection

## 2016-04-06 NOTE — Telephone Encounter (Signed)
Order entered

## 2016-04-12 ENCOUNTER — Telehealth: Payer: Self-pay | Admitting: Medical

## 2016-04-12 NOTE — Telephone Encounter (Signed)
Patient called today.  He says he still hasn't heard back from us or ENT about referral to ENT. Please check into this.  Also make sure pharmacy got the order for the ear drops send per last phone call.

## 2016-04-12 NOTE — Telephone Encounter (Signed)
error 

## 2016-04-12 NOTE — Telephone Encounter (Signed)
Called talked with [facility] ent, they said that they had tried to call him set up an appt.  His appt is for 04/25/16 @ 120pm. Give him the phone # and the address of [facility] ent . Also told him to pick up his meds from the pharmacy .

## 2016-04-13 ENCOUNTER — Telehealth: Payer: Self-pay | Admitting: Family Medicine

## 2016-04-13 NOTE — Telephone Encounter (Signed)
P.A. CIPRODEX

## 2016-04-14 MED ORDER — NEOMYCIN-POLYMYXIN-HC 3.5-10000-1 OT SOLN
4.0000 [drp] | Freq: Four times a day (QID) | OTIC | 0 refills | Status: DC
Start: 2016-04-14 — End: 2016-09-01

## 2016-04-14 NOTE — Telephone Encounter (Signed)
Insurance would not pay unless there was clinical rational for Ciprodex.  Called pharmacy & states Cortisporin & Ofloxacin are covered for $10.  Per Vincenza HewsShane switched to Cortisporin.  Called pt & informed

## 2016-09-01 ENCOUNTER — Ambulatory Visit (INDEPENDENT_AMBULATORY_CARE_PROVIDER_SITE_OTHER): Payer: BLUE CROSS/BLUE SHIELD | Admitting: Medical

## 2016-09-01 ENCOUNTER — Encounter: Payer: Self-pay | Admitting: Medical

## 2016-09-01 VITALS — BP 102/68 | HR 79 | Temp 98.5°F | Wt 123.0 lb

## 2016-09-01 DIAGNOSIS — J069 Acute upper respiratory infection, unspecified: Secondary | ICD-10-CM

## 2016-09-01 DIAGNOSIS — R05 Cough: Secondary | ICD-10-CM

## 2016-09-01 DIAGNOSIS — R059 Cough, unspecified: Secondary | ICD-10-CM

## 2016-09-01 DIAGNOSIS — H663X9 Other chronic suppurative otitis media, unspecified ear: Secondary | ICD-10-CM | POA: Diagnosis not present

## 2016-09-01 MED ORDER — HYDROCODONE-HOMATROPINE 5-1.5 MG/5ML PO SYRP: 5.0000 mL | ORAL_SOLUTION | Freq: Three times a day (TID) | ORAL | 0 refills | Status: DC | PRN

## 2016-09-01 MED ORDER — CIPROFLOXACIN-HYDROCORTISONE 0.2-1 % OT SUSP: 3.0000 [drp] | Freq: Two times a day (BID) | OTIC | 1 refills | Status: DC

## 2016-09-01 MED ORDER — AMOXICILLIN 500 MG PO TABS: ORAL_TABLET | ORAL | 0 refills | Status: DC

## 2016-09-01 NOTE — Progress Notes (Signed)
Subjective: Chief Complaint  Patient presents with  . congestion ,ear aches.    coughing, runny nose , earaches, no fever    Here for left ear discomfort.   Having left ear pain last few days, cough, runny nose, chest discomfort, yellow productive mucous.   No fever, no NVD, no sore throat, no wheezing, no SOB.  No sick contacts.  Headed out of town to drive with family to [place].  Wants to have medication just in case.  He has chronic OM.  Saw ENT recently was given ear drop.  He is out of drops currently.  No other aggravating or relieving factors. No other complaint.   Past Medical History:  Diagnosis Date  . Acne    No current outpatient prescriptions on file prior to visit.   No current facility-administered medications on file prior to visit.    Review of Systems Constitutional: -fever, -chills, -sweats, -unexpected weight change,-fatigue Cardiology:  -chest pain, -palpitations, -edema Gastroenterology: -abdominal pain, -nausea, -vomiting, -diarrhea, -constipation Hematology: -bleeding or bruising problems Musculoskeletal: -arthralgias, -myalgias, -joint swelling, -back pain Ophthalmology: -vision changes, drainage Urology: -dysuria, -difficulty urinating, -hematuria, -urinary frequency, -urgency Neurology: -headache, -weakness, -tingling, -numbness    Objective: BP 102/68   Pulse 79   Temp 98.5 F (36.9 C)   Wt 123 lb (55.8 kg)   SpO2 99%   BMI 21.62 kg/m   General appearance: Alert, WD/WN, no distress                             Skin: warm, no rash                           Head: no sinus tenderness                            Eyes: conjunctiva normal, corneas clear, PERRLA                            Ears: right TM pearly, left TM with erythema, reduced light reflex, otherwise no blood, no drainage, external ear canals normal                          Nose: septum midline, turbinates swollen, with erythema and clear discharge             Mouth/throat: MMM, tongue  normal, mild pharyngeal erythema                           Neck: supple, no adenopathy, no thyromegaly, non tender                           Lungs: CTA bilaterally, no wheezes, rales, or rhonchi       Assessment: Encounter Diagnoses  Name Primary?  Marland Kitchen. Upper respiratory tract infection, unspecified type Yes  . Chronic suppurative otitis media, unspecified laterality, unspecified otitis media location   . Cough     Plan: Advised he use Robitussin DM, rest, hydrate well, begin Cipro HC drops.   Can use Hycodan prn.   If worse or not improving in the next several days, then can begin oral amoxicillin.   Call/return if worse or not improving.   Luis Watson was seen today for  congestion ,ear aches..  Diagnoses and all orders for this visit:  Upper respiratory tract infection, unspecified type  Chronic suppurative otitis media, unspecified laterality, unspecified otitis media location  Cough  Other orders -     amoxicillin (AMOXIL) 500 MG tablet; 2 tablets po BID x 10 days -     ciprofloxacin-hydrocortisone (CIPRO HC) otic suspension; Place 3 drops into the left ear 2 (two) times daily. -     HYDROcodone-homatropine (HYCODAN) 5-1.5 MG/5ML syrup; Take 5 mLs by mouth every 8 (eight) hours as needed for cough.

## 2016-09-01 NOTE — Patient Instructions (Signed)
Consider using daily Zyrtec or other allergy medication OTC at bedtime for the next 2-3 weeks OR you can use OTC Robitussin DM for cough/congestion/ear pressure  If your symptoms worsen in the next few days you can use the Amoxicillin  I refilled the ear drop for worse ear pain in the next few weeks  For worse cough, I prescribed Hycodan syrup.  Don't use this at the same time as Robitussin DM.   The Hycodan syrup may cause drowsiness.  So don't drive within 3-4 hours of taking the Hycodan

## 2016-09-09 ENCOUNTER — Telehealth: Payer: Self-pay | Admitting: Medical

## 2016-09-09 NOTE — Telephone Encounter (Signed)
P,A, Cipro HC, called pharmacy & cost is $500 & insurance will not cover.  The covered alternative is Cortisporin.   Do you want to switch?  Please sed to [facility]

## 2016-09-11 ENCOUNTER — Other Ambulatory Visit: Payer: Self-pay | Admitting: Family Medicine

## 2016-09-11 MED ORDER — AMOXICILLIN-POT CLAVULANATE 875-125 MG PO TABS
1.0000 | ORAL_TABLET | Freq: Two times a day (BID) | ORAL | 0 refills | Status: DC
Start: 2016-09-11 — End: 2016-12-27

## 2016-09-11 NOTE — Telephone Encounter (Signed)
Tell him switching him to an oral antibiotic.

## 2016-09-11 NOTE — Telephone Encounter (Signed)
Called pt informed

## 2016-12-27 ENCOUNTER — Encounter: Payer: Self-pay | Admitting: Medical

## 2016-12-27 ENCOUNTER — Ambulatory Visit (INDEPENDENT_AMBULATORY_CARE_PROVIDER_SITE_OTHER): Payer: Medicaid Other | Admitting: Medical

## 2016-12-27 VITALS — BP 98/64 | HR 81 | Wt 124.0 lb

## 2016-12-27 DIAGNOSIS — G4489 Other headache syndrome: Secondary | ICD-10-CM | POA: Diagnosis not present

## 2016-12-27 DIAGNOSIS — J0100 Acute maxillary sinusitis, unspecified: Secondary | ICD-10-CM | POA: Diagnosis not present

## 2016-12-27 MED ORDER — AMOXICILLIN 875 MG PO TABS: 875.0000 mg | ORAL_TABLET | Freq: Two times a day (BID) | ORAL | 0 refills | Status: DC

## 2016-12-27 MED ORDER — PHENYLEPHRINE-APAP-GUAIFENESIN 5-325-200 MG PO TABS: 1.0000 | ORAL_TABLET | Freq: Two times a day (BID) | ORAL | 0 refills | Status: DC

## 2016-12-27 NOTE — Progress Notes (Signed)
Subjective: Chief Complaint  Patient presents with  . headaches    headaches, hurts behind his eye on the right. started last sunday    Here for headache behind left eye.  Started yesterday one sided.   Had bloody mucous from left nostril yesterday, sore throat last night.  Felt similarly a week ago.   no sick contacts.  Felt feverish last week.    No cough, no NVD, no paresthesias, no vision or hearing changes.  No recent frequency headaches, no hx/o headache problems in general.  No other aggravating or relieving factors. No other complaint.    Past Medical History:  Diagnosis Date  . Acne    No current outpatient prescriptions on file prior to visit.   No current facility-administered medications on file prior to visit.     ROS as in subjective   Objective: BP 98/64   Pulse 81   Wt 124 lb (56.2 kg)   SpO2 96%   BMI 21.79 kg/m   General appearance: alert, no distress, WD/WN,  HEENT: normocephalic, sclerae anicteric,tender over left maxillary sinus, otherwise TMs pearly, nares patent, no discharge or erythema, pharynx normal Oral cavity: MMM, no lesions Neck: supple, no lymphadenopathy, no thyromegaly, no masses Heart: RRR, normal S1, S2, no murmurs Lungs: CTA bilaterally, no wheezes, rhonchi, or rales Neuro: non focal exam Pulses: 2+ symmetric, upper and lower extremities, normal cap refill   Assessment: Encounter Diagnoses  Name Primary?  . Other headache syndrome Yes  . Acute maxillary sinusitis, recurrence not specified     Plan: Symptoms suggest possible sinusitis.  Begin Sudafed PE, hydrate well, rest.  If worse or not improving by weekend, then begin amoxacillin.  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    Luis Watson was seen today for headaches.  Diagnoses and all orders for this visit:  Other headache syndrome  Acute maxillary sinusitis, recurrence not specified  Other orders -      Phenylephrine-APAP-Guaifenesin (SUDAFED PE PRESSURE+PAIN+MUCUS) 5-325-200 MG TABS; Take 1 tablet by mouth 2 (two) times daily. -     amoxicillin (AMOXIL) 875 MG tablet; Take 1 tablet (875 mg total) by mouth 2 (two) times daily.

## 2016-12-27 NOTE — Patient Instructions (Signed)
Recommendations: Begin Sudafed PE as below   If not improving in next 4-5 days then begin amoxicillin  Rest, hydrate well with water

## 2017-08-26 ENCOUNTER — Other Ambulatory Visit: Payer: Self-pay

## 2017-08-26 ENCOUNTER — Encounter (HOSPITAL_COMMUNITY): Payer: Self-pay | Admitting: Emergency Medicine

## 2017-08-26 ENCOUNTER — Emergency Department (HOSPITAL_COMMUNITY)
Admission: EM | Admit: 2017-08-26 | Discharge: 2017-08-26 | Disposition: A | Discharge status: Home / Self Care | Payer: Medicaid Other | Attending: Emergency Medicine | Admitting: Emergency Medicine

## 2017-08-26 DIAGNOSIS — Y939 Activity, unspecified: Secondary | ICD-10-CM | POA: Insufficient documentation

## 2017-08-26 DIAGNOSIS — Z23 Encounter for immunization: Secondary | ICD-10-CM | POA: Insufficient documentation

## 2017-08-26 DIAGNOSIS — Y999 Unspecified external cause status: Secondary | ICD-10-CM | POA: Diagnosis not present

## 2017-08-26 DIAGNOSIS — S80812A Abrasion, left lower leg, initial encounter: Secondary | ICD-10-CM | POA: Insufficient documentation

## 2017-08-26 DIAGNOSIS — Y929 Unspecified place or not applicable: Secondary | ICD-10-CM | POA: Diagnosis not present

## 2017-08-26 DIAGNOSIS — W540XXA Bitten by dog, initial encounter: Secondary | ICD-10-CM | POA: Insufficient documentation

## 2017-08-26 DIAGNOSIS — Z87891 Personal history of nicotine dependence: Secondary | ICD-10-CM | POA: Insufficient documentation

## 2017-08-26 MED ORDER — TETANUS-DIPHTH-ACELL PERTUSSIS 5-2.5-18.5 LF-MCG/0.5 IM SUSP
0.5000 mL | Freq: Once | INTRAMUSCULAR | Status: AC
  Administered 2017-08-26: 0.5 mL via INTRAMUSCULAR
  Filled 2017-08-26: qty 0.5

## 2017-08-26 MED ORDER — AMOXICILLIN-POT CLAVULANATE 875-125 MG PO TABS: 1.0000 | ORAL_TABLET | Freq: Two times a day (BID) | ORAL | 0 refills | Status: DC

## 2017-08-26 NOTE — ED Provider Notes (Signed)
[facility]-EMERGENCY DEPT Provider Note   CSN: [id number] Arrival date & time: 08/26/17  2045     History   Chief Complaint Chief Complaint  Patient presents with  . Animal Bite    HPI Luis Watson is a 32 y.o. male who presents the emergency department today for dog bite.  Patient states that 7:30 PM this afternoon he was bitten by his neighbor's dog on the left lower leg.  He is noted to have superficial abrasions to this area.  He denies any pain at this current time.  No constricted range of motion.  No active bleeding.  He is unsure of the dog's vaccination status.  He is not up-to-date on his tetanus status.  HPI  Past Medical History:  Diagnosis Date  . Acne     There are no active problems to display for this patient.   Past Surgical History:  Procedure Laterality Date  . SOFT TISSUE CYST EXCISION  2011   chest       Home Medications    Prior to Admission medications   Not on File    Family History Family History  Problem Relation Age of Onset  . Cancer Mother        lung  . Asthma Father   . Diabetes Neg Hx   . Heart disease Neg Hx   . Hyperlipidemia Neg Hx   . Hypertension Neg Hx   . Stroke Neg Hx     Social History Social History   Tobacco Use  . Smoking status: Former Smoker    Packs/day: 0.25    Years: 2.00    Pack years: 0.50  . Smokeless tobacco: Never Used  Substance Use Topics  . Alcohol use: Yes    Alcohol/week: 0.6 oz    Types: 1 Standard drinks or equivalent per week    Comment: occasional  . Drug use: No     Allergies   Patient has no known allergies.   Review of Systems Review of Systems  All other systems reviewed and are negative.    Physical Exam Updated Vital Signs BP 121/83 (BP Location: Left Arm)   Pulse 62   Temp 98.3 F (36.8 C) (Oral)   Resp 18   Ht 5\' 5"  (1.651 m)   Wt 58.1 kg (128 lb)   SpO2 99%   BMI 21.30 kg/m   Physical Exam  Constitutional: He appears  well-developed and well-nourished.  HENT:  Head: Normocephalic and atraumatic.  Right Ear: External ear normal.  Left Ear: External ear normal.  Eyes: Conjunctivae are normal. Right eye exhibits no discharge. Left eye exhibits no discharge. No scleral icterus.  Cardiovascular:  Pulses:      Dorsalis pedis pulses are 2+ on the left side.       Posterior tibial pulses are 2+ on the left side.  Pulmonary/Chest: Effort normal. No respiratory distress.  Musculoskeletal:  Lower extremity compartments soft.  Active range of motion of all joints above and below injury intact.  Neurological: He is alert. He has normal strength. No sensory deficit.  Intact sensation to the left lower extremity.  Skin: Skin is warm and dry. Capillary refill takes less than 2 seconds. No pallor.  The patient has a superficial abrasion to the left upper lateral calf that measures 1.5cm. There is no puncture wounds or skin tearing. No laceration. No active bleeding.   Psychiatric: He has a normal mood and affect.  Nursing note and vitals reviewed.  ED Treatments / Results  Labs (all labs ordered are listed, but only abnormal results are displayed) Labs Reviewed - No data to display  EKG  EKG Interpretation None       Radiology No results found.  Procedures Procedures (including critical care time)  Medications Ordered in ED Medications  Tdap (BOOSTRIX) injection 0.5 mL (not administered)     Initial Impression / Assessment and Plan / ED Course  I have reviewed the triage vital signs and the nursing notes.  Pertinent labs & imaging results that were available during my care of the patient were reviewed by me and considered in my medical decision making (see chart for details).     32 y.o. male presenting after being bit by a dog in his left lower extremity.  At presentation he was unsure if the dog's vaccination status but has contacted the owner and reports that the dog is up-to-date on  rabies vaccination.  Exam is with mild superficial abrasion to the left upper extremity.  No puncture wound or tearing of the skin.  No laceration.  Patient's tetanus updated in the department.  Will discharge home on Augmentin.  Patient is to follow with primary care doctor.  Return precautions discussed.  Patient appears safe for discharge.  Final Clinical Impressions(s) / ED Diagnoses   Final diagnoses:  Dog bite, initial encounter    ED Discharge Orders        Ordered    amoxicillin-clavulanate (AUGMENTIN) 875-125 MG tablet  Every 12 hours     08/26/17 2252       Princella Pellegrini 08/26/17 2252    Maia Plan, MD 08/27/17 [phone]

## 2017-08-26 NOTE — ED Triage Notes (Signed)
Patient was bit by neighbors dog on the left lower leg.

## 2017-08-26 NOTE — Discharge Instructions (Signed)
You were seen here today for animal bite. Please take all of your antibiotics until finished!   You may develop abdominal discomfort or diarrhea from the antibiotic.  You may help offset this with probiotics which you can buy or get in yogurt. Do not eat or take the probiotics until 2 hours after your antibiotic. Do not take your medicine if develop an itchy rash, swelling in your mouth or lips, or difficulty breathing.  Please return if you have redness & warmth around the site, streaking of redness up or down your leg, drainage from the wound or run a fever of > 100.4.

## 2017-08-29 ENCOUNTER — Ambulatory Visit: Payer: Medicaid Other | Admitting: Medical

## 2017-08-29 ENCOUNTER — Encounter: Payer: Self-pay | Admitting: Medical

## 2017-08-29 VITALS — BP 110/82 | HR 79 | Temp 98.2°F | Ht 65.0 in | Wt 127.2 lb

## 2017-08-29 DIAGNOSIS — K649 Unspecified hemorrhoids: Secondary | ICD-10-CM | POA: Diagnosis not present

## 2017-08-29 DIAGNOSIS — W540XXD Bitten by dog, subsequent encounter: Secondary | ICD-10-CM

## 2017-08-29 MED ORDER — HYDROCORTISONE 2.5 % RE CREA: 1.0000 "application " | TOPICAL_CREAM | Freq: Two times a day (BID) | RECTAL | 0 refills | Status: DC

## 2017-08-29 NOTE — Progress Notes (Signed)
Subjective: Chief Complaint  Patient presents with  . Follow-up    no concerns, just would like it to check it out   Went to the ED on 08/26/17 for dog bite of left lower leg.  Here for f/u.   He was walking by a neighbors yard when their [place] bit him in the left leg.   He found out that the dog was up to date on vaccines. He was seen in the ED, given updated tetanus, put on Augmentin which he is taking.      He notes a "pile" or hemorrhoid in anus region intermittent the past month.  Never use to get these.  He does sit on toilet a long time, but no bloating , no gas.   Doesn't drink a lot of water.r  No other aggravating or relieving factors. No other complaint.  Past Medical History:  Diagnosis Date  . Acne    Current Outpatient Medications on File Prior to Visit  Medication Sig Dispense Refill  . amoxicillin-clavulanate (AUGMENTIN) 875-125 MG tablet Take 1 tablet by mouth every 12 (twelve) hours. 14 tablet 0   No current facility-administered medications on file prior to visit.    ROS as in subjective   Objective: BP 110/82 (BP Location: Right Arm, Patient Position: Sitting, Cuff Size: Normal)   Pulse 79   Temp 98.2 F (36.8 C) (Oral)   Ht 5\' 5"  (1.651 m)   Wt 127 lb 3.2 oz (57.7 kg)   SpO2 97%   BMI 21.17 kg/m   Gen: wd, wn, nad Skin: left lateral calve region with localized 2cm diameter area of bruising and few superficial abrasions.  Slight induration but no warmth or fluctuance.   Otherwise leg neurovascularly intact DRE - declined exam   Assessment: Encounter Diagnoses  Name Primary?  . Dog bite, subsequent encounter Yes  . Hemorrhoids, unspecified hemorrhoid type      Plan: Reviewed 08/26/17 ED report.  He is taking Augmentin, up to date on Td vaccine, wound looks fine, healing normally.   Advised he finish Augment, discussed hygiene, f/u prn if any signs of infection.  Hemorrhoids - discussed diagnosis, treatment, can use cream below prn short term,  hot soaks/Sitz baths.  Increase water and fiber intake, avoid straining for long time on toilet.  F/u prn.   Dantrell was seen today for follow-up.  Diagnoses and all orders for this visit:  Dog bite, subsequent encounter  Hemorrhoids, unspecified hemorrhoid type  Other orders -     hydrocortisone (ANUSOL-HC) 2.5 % rectal cream; Place 1 application rectally 2 (two) times daily.

## 2017-08-29 NOTE — Patient Instructions (Signed)
Hemorrhoids Hemorrhoids are swollen veins in and around the rectum or anus. There are two types of hemorrhoids:  Internal hemorrhoids. These occur in the veins that are just inside the rectum. They may poke through to the outside and become irritated and painful.  External hemorrhoids. These occur in the veins that are outside of the anus and can be felt as a painful swelling or hard lump near the anus.  Most hemorrhoids do not cause serious problems, and they can be managed with home treatments such as diet and lifestyle changes. If home treatments do not help your symptoms, procedures can be done to shrink or remove the hemorrhoids. What are the causes? This condition is caused by increased pressure in the anal area. This pressure may result from various things, including:  Constipation.  Straining to have a bowel movement.  Diarrhea.  Pregnancy.  Obesity.  Sitting for long periods of time.  Heavy lifting or other activity that causes you to strain.  Anal sex.  What are the signs or symptoms? Symptoms of this condition include:  Pain.  Anal itching or irritation.  Rectal bleeding.  Leakage of stool (feces).  Anal swelling.  One or more lumps around the anus.  How is this diagnosed? This condition can often be diagnosed through a visual exam. Other exams or tests may also be done, such as:  Examination of the rectal area with a gloved hand (digital rectal exam).  Examination of the anal canal using a small tube (anoscope).  A blood test, if you have lost a significant amount of blood.  A test to look inside the colon (sigmoidoscopy or colonoscopy).  How is this treated? This condition can usually be treated at home. However, various procedures may be done if dietary changes, lifestyle changes, and other home treatments do not help your symptoms. These procedures can help make the hemorrhoids smaller or remove them completely. Some of these procedures involve  surgery, and others do not. Common procedures include:  Rubber band ligation. Rubber bands are placed at the base of the hemorrhoids to cut off the blood supply to them.  Sclerotherapy. Medicine is injected into the hemorrhoids to shrink them.  Infrared coagulation. A type of light energy is used to get rid of the hemorrhoids.  Hemorrhoidectomy surgery. The hemorrhoids are surgically removed, and the veins that supply them are tied off.  Stapled hemorrhoidopexy surgery. A circular stapling device is used to remove the hemorrhoids and use staples to cut off the blood supply to them.  Follow these instructions at home: Eating and drinking  Eat foods that have a lot of fiber in them, such as whole grains, beans, nuts, fruits, and vegetables. Ask your health care provider about taking products that have added fiber (fiber supplements).  Drink enough fluid to keep your urine clear or pale yellow. Managing pain and swelling  Take warm sitz baths for 20 minutes, 3-4 times a day to ease pain and discomfort.  If directed, apply ice to the affected area. Using ice packs between sitz baths may be helpful. ? Put ice in a plastic bag. ? Place a towel between your skin and the bag. ? Leave the ice on for 20 minutes, 2-3 times a day. General instructions  Take over-the-counter and prescription medicines only as told by your health care provider.  Use medicated creams or suppositories as told.  Exercise regularly.  Go to the bathroom when you have the urge to have a bowel movement. Do not wait.    Avoid straining to have bowel movements.  Keep the anal area dry and clean. Use wet toilet paper or moist towelettes after a bowel movement.  Do not sit on the toilet for long periods of time. This increases blood pooling and pain. Contact a health care provider if:  You have increasing pain and swelling that are not controlled by treatment or medicine.  You have uncontrolled bleeding.  You  have difficulty having a bowel movement, or you are unable to have a bowel movement.  You have pain or inflammation outside the area of the hemorrhoids. This information is not intended to replace advice given to you by your health care provider. Make sure you discuss any questions you have with your health care provider. Document Released: 05/26/2000 Document Revised: 10/27/2015 Document Reviewed: 02/10/2015 [employer] Interactive Patient Education  2018 [employer]  

## 2017-09-13 ENCOUNTER — Ambulatory Visit: Payer: Medicaid Other | Admitting: Medical

## 2017-09-13 ENCOUNTER — Encounter: Payer: Self-pay | Admitting: Medical

## 2017-09-13 VITALS — BP 118/80 | HR 71 | Ht 64.5 in | Wt 129.0 lb

## 2017-09-13 DIAGNOSIS — I8393 Asymptomatic varicose veins of bilateral lower extremities: Secondary | ICD-10-CM

## 2017-09-13 DIAGNOSIS — Z139 Encounter for screening, unspecified: Secondary | ICD-10-CM

## 2017-09-13 DIAGNOSIS — Z0000 Encounter for general adult medical examination without abnormal findings: Secondary | ICD-10-CM | POA: Diagnosis not present

## 2017-09-13 DIAGNOSIS — Z7189 Other specified counseling: Secondary | ICD-10-CM | POA: Diagnosis not present

## 2017-09-13 DIAGNOSIS — K649 Unspecified hemorrhoids: Secondary | ICD-10-CM

## 2017-09-13 DIAGNOSIS — Z7185 Encounter for immunization safety counseling: Secondary | ICD-10-CM | POA: Insufficient documentation

## 2017-09-13 MED ORDER — HYDROCORTISONE 2.5 % RE CREA: 1.0000 "application " | TOPICAL_CREAM | Freq: Two times a day (BID) | RECTAL | 2 refills | Status: DC

## 2017-09-13 NOTE — Patient Instructions (Addendum)
Thanks for trusting [place] with your health care and for coming in for a physical today.  Below are some general recommendations I have for you:  Yearly screenings See your eye doctor yearly for routine vision care. See your dentist yearly for routine dental care including hygiene visits twice yearly. See me here yearly for a routine physical and preventative care visit   Specific Concerns today:  . Consider using daily lotion for dry skin . See specific handouts below about varicose veins and hemorrhoids   Eye doctors: Dr. Glenford Peers [address] [place] [place], [place] [address] [phone]   [facility] Dr. Gelene Mink [address] [place] [place], [place] [address]  [phone] [email or web address]   Vincenza Hews, M.D. Susanne Greenhouse, O.D. [address] [place], [place] [address] Medical telephone: [phone] Optical telephone: [phone]   Please follow up yearly for a physical.   Hemorrhoids:  I prescribed Hydrocortisone 2.5% cream today.  You can apply this topically to the external hemorrhoid twice daily short term for 3-5 days at a time as needed for itching, irritations, or swelling  Please review the information below  If not improving in the next few days or worsening, then call or recheck.   Hemorrhoids Hemorrhoids are swollen veins in and around the rectum or anus. There are two types of hemorrhoids:  Internal hemorrhoids. These occur in the veins that are just inside the rectum. They may poke through to the outside and become irritated and painful.  External hemorrhoids. These occur in the veins that are outside of the anus and can be felt as a painful swelling or hard lump near the anus.  Most hemorrhoids do not cause serious problems, and they can be managed with home treatments such as diet and lifestyle changes. If home treatments do not help your symptoms, procedures can be done to shrink or remove the  hemorrhoids. What are the causes? This condition is caused by increased pressure in the anal area. This pressure may result from various things, including:  Constipation.  Straining to have a bowel movement.  Diarrhea.  Pregnancy.  Obesity.  Sitting for long periods of time.  Heavy lifting or other activity that causes you to strain.  Anal sex.  What are the signs or symptoms? Symptoms of this condition include:  Pain.  Anal itching or irritation.  Rectal bleeding.  Leakage of stool (feces).  Anal swelling.  One or more lumps around the anus.  How is this diagnosed? This condition can often be diagnosed through a visual exam. Other exams or tests may also be done, such as:  Examination of the rectal area with a gloved hand (digital rectal exam).  Examination of the anal canal using a small tube (anoscope).  A blood test, if you have lost a significant amount of blood.  A test to look inside the colon (sigmoidoscopy or colonoscopy).  How is this treated? This condition can usually be treated at home. However, various procedures may be done if dietary changes, lifestyle changes, and other home treatments do not help your symptoms. These procedures can help make the hemorrhoids smaller or remove them completely. Some of these procedures involve surgery, and others do not. Common procedures include:  Rubber band ligation. Rubber bands are placed at the base of the hemorrhoids to cut off the blood supply to them.  Sclerotherapy. Medicine is injected into the hemorrhoids to shrink them.  Infrared coagulation. A type of light energy  is used to get rid of the hemorrhoids.  Hemorrhoidectomy surgery. The hemorrhoids are surgically removed, and the veins that supply them are tied off.  Stapled hemorrhoidopexy surgery. A circular stapling device is used to remove the hemorrhoids and use staples to cut off the blood supply to them.  Follow these instructions at  home: Eating and drinking  Eat foods that have a lot of fiber in them, such as whole grains, beans, nuts, fruits, and vegetables. Ask your health care provider about taking products that have added fiber (fiber supplements).  Drink enough fluid to keep your urine clear or pale yellow. Managing pain and swelling  Take warm sitz baths for 20 minutes, 3-4 times a day to ease pain and discomfort.  If directed, apply ice to the affected area. Using ice packs between sitz baths may be helpful. ? Put ice in a plastic bag. ? Place a towel between your skin and the bag. ? Leave the ice on for 20 minutes, 2-3 times a day. General instructions  Take over-the-counter and prescription medicines only as told by your health care provider.  Use medicated creams or suppositories as told.  Exercise regularly.  Go to the bathroom when you have the urge to have a bowel movement. Do not wait.  Avoid straining to have bowel movements.  Keep the anal area dry and clean. Use wet toilet paper or moist towelettes after a bowel movement.  Do not sit on the toilet for long periods of time. This increases blood pooling and pain. Contact a health care provider if:  You have increasing pain and swelling that are not controlled by treatment or medicine.  You have uncontrolled bleeding.  You have difficulty having a bowel movement, or you are unable to have a bowel movement.  You have pain or inflammation outside the area of the hemorrhoids. This information is not intended to replace advice given to you by your health care provider. Make sure you discuss any questions you have with your health care provider. Document Released: 05/26/2000 Document Revised: 10/27/2015 Document Reviewed: 02/10/2015 Elsevier Interactive Patient Education  2018 [employer]      Varicose Veins Varicose veins are veins that have become enlarged and twisted. They are usually seen in the legs but can occur in other parts of  the body as well. What are the causes? This condition is the result of valves in the veins not working properly. Valves in the veins help to return blood from the leg to the heart. If these valves are damaged, blood flows backward and backs up into the veins in the leg near the skin. This causes the veins to become larger. What increases the risk? People who are on their feet a lot, who are pregnant, or who are overweight are more likely to develop varicose veins. What are the signs or symptoms?  Bulging, twisted-appearing, bluish veins, most commonly found on the legs.  Leg pain or a feeling of heaviness. These symptoms may be worse at the end of the day.  Leg swelling.  Changes in skin color. How is this diagnosed? A health care provider can usually diagnose varicose veins by examining your legs. Your health care provider may also recommend an ultrasound of your leg veins. How is this treated? Most varicose veins can be treated at home.However, other treatments are available for people who have persistent symptoms or want to improve the cosmetic appearance of the varicose veins. These treatment options include:  Sclerotherapy. A solution is  injected into the vein to close it off.  Laser treatment. A laser is used to heat the vein to close it off.  Radiofrequency vein ablation. An electrical current produced by radio waves is used to close off the vein.  Phlebectomy. The vein is surgically removed through small incisions made over the varicose vein.  Vein ligation and stripping. The vein is surgically removed through incisions made over the varicose vein after the vein has been tied (ligated).  Follow these instructions at home:  Do not stand or sit in one position for long periods of time. Do not sit with your legs crossed. Rest with your legs raised during the day.  Wear compression stockings as directed by your health care provider. These stockings help to prevent blood clots  and reduce swelling in your legs.  Do not wear other tight, encircling garments around your legs, pelvis, or waist.  Walk as much as possible to increase blood flow.  Raise the foot of your bed at night with 2-inch blocks.  If you get a cut in the skin over the vein and the vein bleeds, lie down with your leg raised and press on it with a clean cloth until the bleeding stops. Then place a bandage (dressing) on the cut.  If you have tenderness, you can use warm compress for heat and over the counter Aspirin 81mg , 2 or 3 tablets daily for a few days   See your health care provider if it continues to bleed. Contact a health care provider if:  The skin around your ankle starts to break down.  You have pain, redness, tenderness, or hard swelling in your leg over a vein.  You are uncomfortable because of leg pain. This information is not intended to replace advice given to you by your health care provider. Make sure you discuss any questions you have with your health care provider. Document Released: 03/08/2005 Document Revised: 11/04/2015 Document Reviewed: 11/30/2015 Elsevier Interactive Patient Education  2017 [employer].   Preventative Care for Adults - Male      MAINTAIN REGULAR HEALTH EXAMS:  A routine yearly physical is a good way to check in with your primary care provider about your health and preventive screening. It is also an opportunity to share updates about your health and any concerns you have, and receive a thorough all-over exam.   Most health insurance companies pay for at least some preventative services.  Check with your health plan for specific coverages.  WHAT PREVENTATIVE SERVICES DO WOMEN NEED?  Adult men should have their weight and blood pressure checked regularly.   Men age 43 and older should have their cholesterol levels checked regularly.  Beginning at age 32 and continuing to age 38, men should be screened for colorectal cancer.  Certain people may  need continued testing until age 39.  Updating vaccinations is part of preventative care.  Vaccinations help protect against diseases such as the flu.  Osteoporosis is a disease in which the bones lose minerals and strength as we age. Men ages 51 and over should discuss this with their caregivers  Lab tests are generally done as part of preventative care to screen for anemia and blood disorders, to screen for problems with the kidneys and liver, to screen for bladder problems, to check blood sugar, and to check your cholesterol level.  Preventative services generally include counseling about diet, exercise, avoiding tobacco, drugs, excessive alcohol consumption, and sexually transmitted infections.    GENERAL RECOMMENDATIONS FOR GOOD  HEALTH:  Healthy diet:  Eat a variety of foods, including fruit, vegetables, animal or vegetable protein, such as meat, fish, chicken, and eggs, or beans, lentils, tofu, and grains, such as rice.  Drink plenty of water daily.  Decrease saturated fat in the diet, avoid lots of red meat, processed foods, sweets, fast foods, and fried foods.  Exercise:  Aerobic exercise helps maintain good heart health. At least 30-40 minutes of moderate-intensity exercise is recommended. For example, a brisk walk that increases your heart rate and breathing. This should be done on most days of the week.   Find a type of exercise or a variety of exercises that you enjoy so that it becomes a part of your daily life.  Examples are running, walking, swimming, water aerobics, and biking.  For motivation and support, explore group exercise such as aerobic class, spin class, Zumba, Yoga,or  martial arts, etc.    Set exercise goals for yourself, such as a certain weight goal, walk or run in a race such as a 5k walk/run.  Speak to your primary care provider about exercise goals.  Disease prevention:  If you smoke or chew tobacco, find out from your caregiver how to quit. It can  literally save your life, no matter how long you have been a tobacco user. If you do not use tobacco, never begin.   Maintain a healthy diet and normal weight. Increased weight leads to problems with blood pressure and diabetes.   The Body Mass Index or BMI is a way of measuring how much of your body is fat. Having a BMI above 27 increases the risk of heart disease, diabetes, hypertension, stroke and other problems related to obesity. Your caregiver can help determine your BMI and based on it develop an exercise and dietary program to help you achieve or maintain this important measurement at a healthful level.  High blood pressure causes heart and blood vessel problems.  Persistent high blood pressure should be treated with medicine if weight loss and exercise do not work.   Fat and cholesterol leaves deposits in your arteries that can block them. This causes heart disease and vessel disease elsewhere in your body.  If your cholesterol is found to be high, or if you have heart disease or certain other medical conditions, then you may need to have your cholesterol monitored frequently and be treated with medication.   Ask if you should have a cardiac stress test if your history suggests this. A stress test is a test done on a treadmill that looks for heart disease. This test can find disease prior to there being a problem.  Osteoporosis is a disease in which the bones lose minerals and strength as we age. This can result in serious bone fractures. Risk of osteoporosis can be identified using a bone density scan. Men ages 55 and over should discuss this with their caregivers. Ask your caregiver whether you should be taking a calcium supplement and Vitamin D, to reduce the rate of osteoporosis.   Avoid drinking alcohol in excess (more than two drinks per day).  Avoid use of street drugs. Do not share needles with anyone. Ask for professional help if you need assistance or instructions on stopping the use  of alcohol, cigarettes, and/or drugs.  Brush your teeth twice a day with fluoride toothpaste, and floss once a day. Good oral hygiene prevents tooth decay and gum disease. The problems can be painful, unattractive, and can cause other health problems. Visit your  dentist for a routine oral and dental check up and preventive care every 6-12 months.   Look at your skin regularly.  Use a mirror to look at your back. Notify your caregivers of changes in moles, especially if there are changes in shapes, colors, a size larger than a pencil eraser, an irregular border, or development of new moles.  Safety:  Use seatbelts 100% of the time, whether driving or as a passenger.  Use safety devices such as hearing protection if you work in environments with loud noise or significant background noise.  Use safety glasses when doing any work that could send debris in to the eyes.  Use a helmet if you ride a bike or motorcycle.  Use appropriate safety gear for contact sports.  Talk to your caregiver about gun safety.  Use sunscreen with a SPF (or skin protection factor) of 15 or greater.  Lighter skinned people are at a greater risk of skin cancer. Don't forget to also wear sunglasses in order to protect your eyes from too much damaging sunlight. Damaging sunlight can accelerate cataract formation.   Practice safe sex. Use condoms. Condoms are used for birth control and to help reduce the spread of sexually transmitted infections (or STIs).  Some of the STIs are gonorrhea (the clap), chlamydia, syphilis, trichomonas, herpes, HPV (human papilloma virus) and HIV (human immunodeficiency virus) which causes AIDS. The herpes, HIV and HPV are viral illnesses that have no cure. These can result in disability, cancer and death.   Keep carbon monoxide and smoke detectors in your home functioning at all times. Change the batteries every 6 months or use a model that plugs into the wall.   Vaccinations:  Stay up to date with  your tetanus shots and other required immunizations. You should have a booster for tetanus every 10 years. Be sure to get your flu shot every year, since 5%-20% of the [place] population comes down with the flu. The flu vaccine changes each year, so being vaccinated once is not enough. Get your shot in the fall, before the flu season peaks.   Other vaccines to consider:  Human Papilloma Virus or HPV causes cancer of the cervix, and other infections that can be transmitted from person to person. There is a vaccine for HPV, and males should get immunized between the ages of 55 and 67. It requires a series of 3 shots.   Pneumococcal vaccine to protect against certain types of pneumonia.  This is normally recommended for adults age 75 or older.  However, adults younger than 31 years old with certain underlying conditions such as diabetes, heart or lung disease should also receive the vaccine.  Shingles vaccine to protect against Varicella Zoster if you are older than age 76, or younger than 31 years old with certain underlying illness.  If you have not had the Shingrix vaccine, please call your insurer to inquire about coverage for the Shingrix vaccine given in 2 doses.   Some insurers cover this vaccine after age 51, some cover this after age 68.  If your insurer covers this, then call to schedule appointment to have this vaccine here  Hepatitis A vaccine to protect against a form of infection of the liver by a virus acquired from food.  Hepatitis B vaccine to protect against a form of infection of the liver by a virus acquired from blood or body fluids, particularly if you work in health care.  If you plan to travel internationally, check with your  local health department for specific vaccination recommendations.   What should I know about cancer screening? Many types of cancers can be detected early and may often be prevented. Lung Cancer  You should be screened every year for lung cancer if: ? You  are a current smoker who has smoked for at least 30 years. ? You are a former smoker who has quit within the past 15 years.  Talk to your health care provider about your screening options, when you should start screening, and how often you should be screened.  Colorectal Cancer  Routine colorectal cancer screening usually begins at 31 years of age and should be repeated every 5-10 years until you are 31 years old. You may need to be screened more often if early forms of precancerous polyps or small growths are found. Your health care provider may recommend screening at an earlier age if you have risk factors for colon cancer.  Your health care provider may recommend using home test kits to check for hidden blood in the stool.  A small camera at the end of a tube can be used to examine your colon (sigmoidoscopy or colonoscopy). This checks for the earliest forms of colorectal cancer.  Prostate and Testicular Cancer  Depending on your age and overall health, your health care provider may do certain tests to screen for prostate and testicular cancer.  Talk to your health care provider about any symptoms or concerns you have about testicular or prostate cancer.  Skin Cancer  Check your skin from head to toe regularly.  Tell your health care provider about any new moles or changes in moles, especially if: ? There is a change in a mole's size, shape, or color. ? You have a mole that is larger than a pencil eraser.  Always use sunscreen. Apply sunscreen liberally and repeat throughout the day.  Protect yourself by wearing long sleeves, pants, a wide-brimmed hat, and sunglasses when outside.

## 2017-09-13 NOTE — Progress Notes (Signed)
Subjective:   HPI  Luis Watson is a 32 y.o. male who presents for Chief Complaint  Patient presents with  . Annual Exam    no concerns    Medical care team includes: Susan Arana, Kermit Balo, PA-C here for primary care Dentist Eye doctor  Concerns: intermittent issues with hemorrhoids  From time to time has tender area of calves posteriorly  Reviewed their medical, surgical, family, social, medication, and allergy history and updated chart as appropriate.  Past Medical History:  Diagnosis Date  . Acne     Past Surgical History:  Procedure Laterality Date  . SOFT TISSUE CYST EXCISION  2011   chest    Social History   Socioeconomic History  . Marital status: Married    Spouse name: Not on file  . Number of children: Not on file  . Years of education: Not on file  . Highest education level: Not on file  Occupational History  . Not on file  Social Needs  . Financial resource strain: Not on file  . Food insecurity:    Worry: Not on file    Inability: Not on file  . Transportation needs:    Medical: Not on file    Non-medical: Not on file  Tobacco Use  . Smoking status: Former Smoker    Packs/day: 0.25    Years: 2.00    Pack years: 0.50  . Smokeless tobacco: Never Used  Substance and Sexual Activity  . Alcohol use: Yes    Alcohol/week: 0.6 oz    Types: 1 Standard drinks or equivalent per week    Comment: occasional  . Drug use: No  . 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 Physical activity:    Days per week: Not on file    Minutes per session: Not on file  . Stress: Not on file  Relationships  . Social connections:    Talks on phone: Not on file    Gets together: Not on file    Attends religious service: Not on file    Active member of club or organization: Not on file    Attends meetings of clubs or organizations: Not on file    Relationship status: Not on file  .  Intimate partner violence:    Fear of current or ex partner: Not on file    Emotionally abused: Not on file    Physically abused: Not on file    Forced sexual activity: Not on file  Other Topics Concern  . 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 Cancer Mother        lung  . Asthma Father   . Diabetes Neg Hx   . Heart disease Neg Hx   . Hyperlipidemia Neg Hx   . Hypertension Neg Hx   . Stroke Neg Hx      Current Outpatient Medications:  .  hydrocortisone (ANUSOL-HC) 2.5 % rectal cream, Place 1 application rectally 2 (two) times daily., Disp: 30 g, Rfl: 2  No Known Allergies    Review of Systems Constitutional: -fever, -chills, -sweats, -unexpected weight change, -decreased appetite, -fatigue Allergy: -sneezing, -itching, -congestion Dermatology: -changing moles, --rash, -lumps ENT: -  runny nose, -ear pain, -sore throat, -hoarseness, -sinus pain, -teeth pain, - ringing in ears, -hearing loss, -nosebleeds Cardiology: -chest pain, -palpitations, -swelling, -difficulty breathing when lying flat, -waking up short of breath Respiratory: -cough, -shortness of breath, -difficulty breathing with exercise or exertion, -wheezing, -coughing up blood Gastroenterology: -abdominal pain, -nausea, -vomiting, -diarrhea, -constipation, -blood in stool, -changes in bowel movement, -difficulty swallowing or eating Hematology: -bleeding, -bruising  Musculoskeletal: -joint aches, -muscle aches, -joint swelling, -back pain, -neck pain, -cramping, -changes in gait Ophthalmology: denies vision changes, eye redness, itching, discharge Urology: -burning with urination, -difficulty urinating, -blood in urine, -urinary frequency, -urgency, -incontinence Neurology: -headache, -weakness, -tingling, -numbness,  -memory loss, -falls, -dizziness Psychology: -depressed mood, -agitation, -sleep problems Male GU: no testicular mass, pain, no lymph nodes swollen, no swelling, no rash.     Objective:  BP 118/80 (BP Location: Right Arm, Patient Position: Sitting, Cuff Size: Normal)   Pulse 71   Ht 5' 4.5" (1.638 m)   Wt 129 lb (58.5 kg)   SpO2 94%   BMI 21.80 kg/m   General appearance: alert, no distress, WD/WN, Nepali male Skin: no worrisome findings HEENT: normocephalic, conjunctiva/corneas normal, sclerae anicteric, PERRLA, EOMi, nares patent, no discharge or erythema, pharynx normal Oral cavity: MMM, tongue normal, teeth normal Neck: supple, no lymphadenopathy, no thyromegaly, no masses, normal ROM, no bruits Chest: non tender, normal shape and expansion Heart: RRR, normal S1, S2, no murmurs Lungs: CTA bilaterally, no wheezes, rhonchi, or rales Abdomen: +bs, soft, non tender, non distended, no masses, no hepatomegaly, no splenomegaly, no bruits Back: non tender, normal ROM, no scoliosis Musculoskeletal: upper extremities non tender, no obvious deformity, normal ROM throughout, lower extremities non tender, no obvious deformity, normal ROM throughout Extremities: mild varicosities of bilat lower legs, worse on left, no edema, no cyanosis, no clubbing Pulses: 2+ symmetric, upper and lower extremities, normal cap refill Neurological: alert, oriented x 3, CN2-12 intact, strength normal upper extremities and lower extremities, sensation normal throughout, DTRs 2+ throughout, no cerebellar signs, gait normal Psychiatric: normal affect, behavior normal, pleasant  GU: deferred Rectal: anus normal apearing, no extenral lesions, no fissure, nontender.   Assessment and Plan :   Encounter Diagnoses  Name Primary?  . Encounter for health maintenance examination in adult Yes  . Vaccine counseling   . Varicose veins of both lower extremities, unspecified whether complicated   . Hemorrhoids, unspecified  hemorrhoid type   . Screening for condition     Physical exam - discussed and counseled on healthy lifestyle, diet, exercise, preventative care, vaccinations, sick and well care, proper use of emergency dept and after hours care, and addressed their concerns.    Health screening: See your eye doctor yearly for routine vision care. See your dentist yearly for routine dental care including hygiene visits twice yearly.  Cancer screening Advised monthly self testicular exam  Vaccinations: Advised yearly influenza vaccine  Acute issues discussed: none  Separate significant chronic issues discussed: Gave home care recommendations for hemorrhoids, varicose veins.  prescribed prn use hydrocortisone cream for hemorrhoid as discussed.  Mohammedali was seen today for annual exam.  Diagnoses and all orders for this visit:  Encounter for health maintenance examination in adult -     Comprehensive metabolic panel -     CBC -     Lipid panel -     Measles/Mumps/Rubella Immunity  Vaccine counseling  Varicose veins of both lower extremities, unspecified whether complicated  Hemorrhoids, unspecified hemorrhoid type  Screening for condition -  Measles/Mumps/Rubella Immunity  Other orders -     hydrocortisone (ANUSOL-HC) 2.5 % rectal cream; Place 1 application rectally 2 (two) times daily.   Follow-up pending labs, yearly for physical

## 2017-09-14 ENCOUNTER — Telehealth: Payer: Self-pay

## 2017-09-14 LAB — CBC
Hematocrit: 43.8 % (ref 37.5–51.0)
Hemoglobin: 15 g/dL (ref 13.0–17.7)
MCH: 29.1 pg (ref 26.6–33.0)
MCHC: 34.2 g/dL (ref 31.5–35.7)
MCV: 85 fL (ref 79–97)
PLATELETS: 190 10*3/uL (ref 150–379)
RBC: 5.16 x10E6/uL (ref 4.14–5.80)
RDW: 13.3 % (ref 12.3–15.4)
WBC: 5.1 10*3/uL (ref 3.4–10.8)

## 2017-09-14 LAB — COMPREHENSIVE METABOLIC PANEL
A/G RATIO: 1.6 (ref 1.2–2.2)
ALK PHOS: 54 IU/L (ref 39–117)
ALT: 24 IU/L (ref 0–44)
AST: 18 IU/L (ref 0–40)
Albumin: 4.9 g/dL (ref 3.5–5.5)
BUN/Creatinine Ratio: 12 (ref 9–20)
BUN: 14 mg/dL (ref 6–20)
Bilirubin Total: 0.5 mg/dL (ref 0.0–1.2)
CO2: 19 mmol/L — ABNORMAL LOW (ref 20–29)
Calcium: 9.5 mg/dL (ref 8.7–10.2)
Chloride: 103 mmol/L (ref 96–106)
Creatinine, Ser: 1.13 mg/dL (ref 0.76–1.27)
GFR calc Af Amer: 100 mL/min/{1.73_m2} (ref >59)
GFR calc non Af Amer: 86 mL/min/{1.73_m2} (ref >59)
GLOBULIN, TOTAL: 3.1 g/dL (ref 1.5–4.5)
Glucose: 78 mg/dL (ref 65–99)
POTASSIUM: 4.8 mmol/L (ref 3.5–5.2)
SODIUM: 141 mmol/L (ref 134–144)
Total Protein: 8 g/dL (ref 6.0–8.5)

## 2017-09-14 LAB — LIPID PANEL
Chol/HDL Ratio: 3.8 ratio (ref 0.0–5.0)
Cholesterol, Total: 220 mg/dL — ABNORMAL HIGH (ref 100–199)
HDL: 58 mg/dL (ref >39)
LDL Calculated: 126 mg/dL — ABNORMAL HIGH (ref 0–99)
TRIGLYCERIDES: 180 mg/dL — AB (ref 0–149)
VLDL CHOLESTEROL CAL: 36 mg/dL (ref 5–40)

## 2017-09-14 LAB — MEASLES/MUMPS/RUBELLA IMMUNITY
MUMPS ABS, IGG: 133 AU/mL (ref >10.9)
RUBELLA: 7.91 {index} (ref >0.99)
RUBEOLA AB, IGG: 221 [AU]/ml (ref >29.9)

## 2017-09-14 NOTE — Telephone Encounter (Signed)
-----   Message from Jac Canavanavid S Tysinger, PA-C sent at 09/14/2017  8:07 AM EDT ----- Labs were all fine.   He is also immune to mumps measles and rubella.   F/u yearly for physical

## 2017-09-14 NOTE — Telephone Encounter (Signed)
Called patient and gave lab results. Patient had no questions or concerns.  

## 2017-12-11 ENCOUNTER — Ambulatory Visit: Payer: Medicaid Other | Admitting: Medical

## 2017-12-11 VITALS — BP 120/70 | HR 90 | Temp 98.0°F | Resp 16 | Ht 63.75 in | Wt 128.8 lb

## 2017-12-11 DIAGNOSIS — L853 Xerosis cutis: Secondary | ICD-10-CM

## 2017-12-11 DIAGNOSIS — K644 Residual hemorrhoidal skin tags: Secondary | ICD-10-CM | POA: Diagnosis not present

## 2017-12-11 MED ORDER — SITZ BATH MISC: 1.0000 | Freq: Every day | 2 refills | Status: DC | PRN

## 2017-12-11 MED ORDER — HYDROCORTISONE 2.5 % RE CREA: 1.0000 "application " | TOPICAL_CREAM | Freq: Two times a day (BID) | RECTAL | 2 refills | Status: DC

## 2017-12-11 MED ORDER — LUBRIDERM DAILY MOISTURE EX LOTN: 1.0000 | TOPICAL_LOTION | Freq: Every day | CUTANEOUS | 2 refills | Status: DC

## 2017-12-11 NOTE — Patient Instructions (Addendum)
Hemorrhoids  Avoid straining or lifting heavy objects  Try not to spend a long time while on the toilet pushing down waiting on stool come out  Try to make the bathroom toileting quick and efficient  Eat at least 25 g of fiber daily, and drink at least 64 ounces of water every day  If you do feel bloated a backed up or constipated you can use over-the-counter stool softeners for a few days such as Docusate Sodium  You can use the hemorrhoid cream 2-3 times a day when you have a flareup like today  Use hot soapy bath soaks for 20 minutes daily or you can use an over-the-counter product called sitz bath  If the hemorrhoid continues to be a problem and will not go away, we can either cut it off here or we can send you to a gastroenterologist specialist and they can use a procedure to remove the hemorrhoid  If you ever have a painful swollen purple hemorrhoid or you can barely stand or sit because the pain can come in as this suggest a thrombosed hemorrhoid  For the dry skin of your legs using daily moisturizing lotion such as Lubriderm  Make sure you are drinking plenty of water daily    Hemorrhoids Hemorrhoids are swollen veins in and around the rectum or anus. Hemorrhoids can cause pain, itching, or bleeding. Most of the time, they do not cause serious problems. They usually get better with diet changes, lifestyle changes, and other home treatments. Follow these instructions at home: Eating and drinking  Eat foods that have fiber, such as whole grains, beans, nuts, fruits, and vegetables. Ask your doctor about taking products that have added fiber (fibersupplements).  Drink enough fluid to keep your pee (urine) clear or pale yellow. For Pain and Swelling  Take a warm-water bath (sitz bath) for 20 minutes to ease pain. Do this 3-4 times a day.  If directed, put ice on the painful area. It may be helpful to use ice between your warm baths. ? Put ice in a plastic bag. ? Place a  towel between your skin and the bag. ? Leave the ice on for 20 minutes, 2-3 times a day. General instructions  Take over-the-counter and prescription medicines only as told by your doctor. ? Medicated creams and medicines that are inserted into the anus (suppositories) may be used or applied as told.  Exercise often.  Go to the bathroom when you have the urge to poop (to have a bowel movement). Do not wait.  Avoid pushing too hard (straining) when you poop.  Keep the butt area dry and clean. Use wet toilet paper or moist paper towels.  Do not sit on the toilet for a long time. Contact a doctor if:  You have any of these: ? Pain and swelling that do not get better with treatment or medicine. ? Bleeding that will not stop. ? Trouble pooping or you cannot poop. ? Pain or swelling outside the area of the hemorrhoids. This information is not intended to replace advice given to you by your health care provider. Make sure you discuss any questions you have with your health care provider. Document Released: 03/07/2008 Document Revised: 11/04/2015 Document Reviewed: 02/10/2015 Elsevier Interactive Patient Education  [employer]2018 Elsevier Inc.

## 2017-12-11 NOTE — Progress Notes (Signed)
Subjective: Chief Complaint  Patient presents with  . Follow-up    headaches, itchy all over body   Here for a few concerns.  Has hemorrhoid that was painful and hurt to walk this past week.   Its a little better today.  Has had some lingering problems with this.  Having some leg itching at night.   itching both lower legs, not anywhere else.   Takes a shower every day.  Using no treatment.   No other aggravating or relieving factors. No other complaint.   Past Medical History:  Diagnosis Date  . Acne    No current outpatient medications on file prior to visit.   No current facility-administered medications on file prior to visit.    ROS as in subjective   Objective: BP 120/70   Pulse 90   Temp 98 F (36.7 C) (Oral)   Resp 16   Ht 5' 3.75" (1.619 m)   Wt 128 lb 12.8 oz (58.4 kg)   SpO2 98%   BMI 22.28 kg/m   Gen: wd, wn, nad Dry skin of lower legs Legs neurovascularly intact There is a 1.2 cm diameter non thrombosed hemorrhoid somewhat inflamed at anus   Assessment: Encounter Diagnoses  Name Primary?  . External hemorrhoid Yes  . Dry skin      Plan: Discussed findings, preventative measures for hemorrhoid.  Offered hemorrhoid excision procedure vs supportive care vs referral to GI.  He wants to use conservative measures.   Patient Instructions  Hemorrhoids  Avoid straining or lifting heavy objects  Try not to spend a long time while on the toilet pushing down waiting on stool come out  Try to make the bathroom toileting quick and efficient  Eat at least 25 g of fiber daily, and drink at least 64 ounces of water every day  If you do feel bloated a backed up or constipated you can use over-the-counter stool softeners for a few days such as Docusate Sodium  You can use the hemorrhoid cream 2-3 times a day when you have a flareup like today  Use hot soapy bath soaks for 20 minutes daily or you can use an over-the-counter product called sitz  bath  If the hemorrhoid continues to be a problem and will not go away, we can either cut it off here or we can send you to a gastroenterologist specialist and they can use a procedure to remove the hemorrhoid  If you ever have a painful swollen purple hemorrhoid or you can barely stand or sit because the pain can come in as this suggest a thrombosed hemorrhoid  For the dry skin of your legs using daily moisturizing lotion such as Lubriderm  Make sure you are drinking plenty of water daily    Hemorrhoids Hemorrhoids are swollen veins in and around the rectum or anus. Hemorrhoids can cause pain, itching, or bleeding. Most of the time, they do not cause serious problems. They usually get better with diet changes, lifestyle changes, and other home treatments. Follow these instructions at home: Eating and drinking  Eat foods that have fiber, such as whole grains, beans, nuts, fruits, and vegetables. Ask your doctor about taking products that have added fiber (fibersupplements).  Drink enough fluid to keep your pee (urine) clear or pale yellow. For Pain and Swelling  Take a warm-water bath (sitz bath) for 20 minutes to ease pain. Do this 3-4 times a day.  If directed, put ice on the painful area. It may be helpful  to use ice between your warm baths. ? Put ice in a plastic bag. ? Place a towel between your skin and the bag. ? Leave the ice on for 20 minutes, 2-3 times a day. General instructions  Take over-the-counter and prescription medicines only as told by your doctor. ? Medicated creams and medicines that are inserted into the anus (suppositories) may be used or applied as told.  Exercise often.  Go to the bathroom when you have the urge to poop (to have a bowel movement). Do not wait.  Avoid pushing too hard (straining) when you poop.  Keep the butt area dry and clean. Use wet toilet paper or moist paper towels.  Do not sit on the toilet for a long time. Contact a doctor  if:  You have any of these: ? Pain and swelling that do not get better with treatment or medicine. ? Bleeding that will not stop. ? Trouble pooping or you cannot poop. ? Pain or swelling outside the area of the hemorrhoids. This information is not intended to replace advice given to you by your health care provider. Make sure you discuss any questions you have with your health care provider. Document Released: 03/07/2008 Document Revised: 11/04/2015 Document Reviewed: 02/10/2015 Elsevier Interactive Patient Education  2018 [employer]   [place] was seen today for follow-up.  Diagnoses and all orders for this visit:  External hemorrhoid  Dry skin  Other orders -     Misc. Devices (SITZ BATH) MISC; 1 each by Does not apply route daily as needed. -     hydrocortisone (ANUSOL-HC) 2.5 % rectal cream; Place 1 application rectally 2 (two) times daily. -     Emollient (LUBRIDERM DAILY MOISTURE) LOTN; Apply 1 each topically daily.

## 2018-03-06 ENCOUNTER — Ambulatory Visit: Payer: Medicaid Other | Admitting: Family Medicine

## 2018-03-06 ENCOUNTER — Encounter: Payer: Self-pay | Admitting: Family Medicine

## 2018-03-06 VITALS — BP 114/70 | HR 84 | Temp 98.4°F | Ht 65.0 in | Wt 130.2 lb

## 2018-03-06 DIAGNOSIS — Z23 Encounter for immunization: Secondary | ICD-10-CM | POA: Diagnosis not present

## 2018-03-06 DIAGNOSIS — B029 Zoster without complications: Secondary | ICD-10-CM

## 2018-03-06 MED ORDER — VALACYCLOVIR HCL 1 G PO TABS: 1000.0000 mg | ORAL_TABLET | Freq: Three times a day (TID) | ORAL | 0 refills | Status: DC

## 2018-03-06 NOTE — Patient Instructions (Signed)
Take the medication 3 times daily for 7 days. You may use tylenol and/or ibuprofen as needed for pain.  You can use up to 800mg  of ibuprofen three times daily, if needed.  This is the equivalent of prescription strength ibuprofen (taking 4 over-the-counter tablets together).  Be sure to take this with food, and to cut back on the dose if it bothers your stomach. If your pain is not well controlled with these measures, please let us know and we can prescribe a pain medication.   Shingles Shingles, which is also known as herpes zoster, is an infection that causes a painful skin rash and fluid-filled blisters. Shingles is not related to genital herpes, which is a sexually transmitted infection. Shingles only develops in people who:  Have had chickenpox.  Have received the chickenpox vaccine. (This is rare.)  What are the causes? Shingles is caused by varicella-zoster virus (VZV). This is the same virus that causes chickenpox. After exposure to VZV, the virus stays in the body in an inactive (dormant) state. Shingles develops if the virus reactivates. This can happen many years after the initial exposure to VZV. It is not known what causes this virus to reactivate. What increases the risk? People who have had chickenpox or received the chickenpox vaccine are at risk for shingles. Infection is more common in people who:  Are older than age 41.  Have a weakened defense (immune) system, such as those with HIV, AIDS, or cancer.  Are taking medicines that weaken the immune system, such as transplant medicines.  Are under great stress.  What are the signs or symptoms? Early symptoms of this condition include itching, tingling, and pain in an area on your skin. Pain may be described as burning, stabbing, or throbbing. A few days or weeks after symptoms start, a painful red rash appears, usually on one side of the body in a bandlike or beltlike pattern. The rash eventually turns into fluid-filled  blisters that break open, scab over, and dry up in about 2-3 weeks. At any time during the infection, you may also develop:  A fever.  Chills.  A headache.  An upset stomach.  How is this diagnosed? This condition is diagnosed with a skin exam. Sometimes, skin or fluid samples are taken from the blisters before a diagnosis is made. These samples are examined under a microscope or sent to a lab for testing. How is this treated? There is no specific cure for this condition. Your health care provider will probably prescribe medicines to help you manage pain, recover more quickly, and avoid long-term problems. Medicines may include:  Antiviral drugs.  Anti-inflammatory drugs.  Pain medicines.  If the area involved is on your face, you may be referred to a specialist, such as an eye doctor (ophthalmologist) or an ear, nose, and throat (ENT) doctor to help you avoid eye problems, chronic pain, or disability. Follow these instructions at home: Medicines  Take medicines only as directed by your health care provider.  Apply an anti-itch or numbing cream to the affected area as directed by your health care provider. Blister and Rash Care  Take a cool bath or apply cool compresses to the area of the rash or blisters as directed by your health care provider. This may help with pain and itching.  Keep your rash covered with a loose bandage (dressing). Wear loose-fitting clothing to help ease the pain of material rubbing against the rash.  Keep your rash and blisters clean with mild soap  and cool water or as directed by your health care provider.  Check your rash every day for signs of infection. These include redness, swelling, and pain that lasts or increases.  Do not pick your blisters.  Do not scratch your rash. General instructions  Rest as directed by your health care provider.  Keep all follow-up visits as directed by your health care provider. This is important.  Until your  blisters scab over, your infection can cause chickenpox in people who have never had it or been vaccinated against it. To prevent this from happening, avoid contact with other people, especially: ? Babies. ? Pregnant women. ? Children who have eczema. ? Elderly people who have transplants. ? People who have chronic illnesses, such as leukemia or AIDS. Contact a health care provider if:  Your pain is not relieved with prescribed medicines.  Your pain does not get better after the rash heals.  Your rash looks infected. Signs of infection include redness, swelling, and pain that lasts or increases. Get help right away if:  The rash is on your face or nose.  You have facial pain, pain around your eye area, or loss of feeling on one side of your face.  You have ear pain or you have ringing in your ear.  You have loss of taste.  Your condition gets worse. This information is not intended to replace advice given to you by your health care provider. Make sure you discuss any questions you have with your health care provider. Document Released: 05/29/2005 Document Revised: 01/23/2016 Document Reviewed: 04/09/2014 Elsevier Interactive Patient Education  2018 [employer].

## 2018-03-06 NOTE — Progress Notes (Signed)
Chief Complaint  Patient presents with  . Rash    back and left side of abdomen    2 days he noticed a rash on his left back, later that day noticed the rash on his stomach.  It is painful, not itchy. Pain hasn't been bad enough to require medication, but has gotten gradually worse.  The back doesn't bother him anymore, but the chest is hurting some today.  No fever or chills. No known h/o chicken pox.  Got lots of vaccinations when he came to this country about 10 years ago (from [place]). There is documented varicella vaccine in 01/2009 in his immunization record.  Only got 1 vaccine. Doesn't recall having a rash or reaction.  PMH, PSH, SH reviewed  No current outpatient medications on file prior to visit.   No current facility-administered medications on file prior to visit.    No Known Allergies  ROS: no fever, chills, headache, dizziness, other rashes, bleeding, bruising, GI complaints, numbness, tingling or other concerns.   PHYSICAL EXAM:  BP 114/70   Pulse 84   Temp 98.4 F (36.9 C) (Oral)   Ht 5\' 5"  (1.651 m)   Wt 130 lb 3.2 oz (59.1 kg)   SpO2 98%   BMI 21.67 kg/m   Well-appearing, pleasant male, in no distress Skin: Cluster of blisters on erythematous base at left anterior chest (below and medial to nipple in T6 distribution). A couple of scattered papules are present lateral to this, along the same dermatome. He also has a larger, crusted/scabbed cluster of lesions on the back, also in T6 distribution.  No significant surrounding erythema, warmth, drainage  ASSESSMENT/PLAN:  Herpes zoster without complication - Plan: valACYclovir (VALTREX) 1000 MG tablet  Discussed dx, risks, potential course. Symptoms are mild now, discussed treatment with OTC pain meds. To contact us if pain worsens, not controlled by OTC meds. Instructed whom to avoid until all lesions are scabbed over.  Unclear if he could have had varicella as a child prior to coming to this country, vs is  getting this from varicella vaccine exposure to the live virus. Hopefully will continue to be a mild course.

## 2018-03-07 ENCOUNTER — Ambulatory Visit: Payer: Medicaid Other | Admitting: Family Medicine

## 2018-07-16 ENCOUNTER — Ambulatory Visit: Payer: Medicaid Other | Admitting: Medical

## 2018-07-16 ENCOUNTER — Encounter: Payer: Self-pay | Admitting: Medical

## 2018-07-16 VITALS — BP 110/70 | HR 110 | Temp 98.6°F | Resp 16 | Ht 65.0 in | Wt 132.8 lb

## 2018-07-16 DIAGNOSIS — J111 Influenza due to unidentified influenza virus with other respiratory manifestations: Secondary | ICD-10-CM | POA: Diagnosis not present

## 2018-07-16 DIAGNOSIS — R6889 Other general symptoms and signs: Secondary | ICD-10-CM

## 2018-07-16 LAB — POCT INFLUENZA A/B
Influenza A, POC: NEGATIVE
Influenza B, POC: NEGATIVE

## 2018-07-16 MED ORDER — OSELTAMIVIR PHOSPHATE 75 MG PO CAPS: 75.0000 mg | ORAL_CAPSULE | Freq: Two times a day (BID) | ORAL | 0 refills | Status: DC

## 2018-07-16 NOTE — Progress Notes (Signed)
   Subjective:  Luis Watson is a 33 y.o. male who presents for possible influenza.  Symptoms include 1 day history of cough, chest pain from the coughing, ear pain, sore throat, headache, subjective fever, body aches, chills, nausea.  No vomiting or diarrhea.  No wheezing, no shortness of breath.  He denies any sick contacts.  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  Currently taking nothing for his symptoms.  He is trying to hydrate well.  He is a non-smoker.  No history of asthma or lung disease  No other aggravating or relieving factors.  No other c/o.  The following portions of the patient's history were reviewed and updated as appropriate: allergies, current medications, past medical history, past social history and problem list.  ROS as in subjective   Past Medical History:  Diagnosis Date  . Acne    Current Outpatient Medications on File Prior to Visit  Medication Sig Dispense Refill  . valACYclovir (VALTREX) 1000 MG tablet Take 1 tablet (1,000 mg total) by mouth 3 (three) times daily. (Patient not taking: Reported on 07/16/2018) 21 tablet 0   No current facility-administered medications on file prior to visit.      Objective: BP 110/70   Pulse (!) 110   Temp 98.6 F (37 C) (Oral)   Resp 16   Ht 5\' 5"  (1.651 m)   Wt 132 lb 12.8 oz (60.2 kg)   SpO2 97%   BMI 22.10 kg/m   General: Ill-appearing, well-developed, well-nourished Skin: Hot, dry HEENT: Nose inflamed and congested, clear conjunctiva, TMs pearly, no sinus tenderness, pharynx with erythema, no exudates Neck: Supple, non tender, shotty cervical adenopathy Heart: Regular rate and rhythm, normal S1, S2, no murmurs Lungs: Clear to auscultation bilaterally, no wheezes, rales, rhonchi Abdomen: Non tender non distended Extremities: Mild generalized tenderness   Assessment: Encounter Diagnoses  Name Primary?  . Influenza Yes  . Flu-like symptoms      Plan: Prescription given for Tamiflu, discussed risks/benefits of  medication.  I also called out a Tamiflu prescription for prophylaxis for his wife Bhimi Peguero who is also my patient  Discussed diagnosis of influenza. Discussed supportive care including rest, hydration, OTC Tylenol or NSAID for fever, aches, and malaise.  Discussed period of contagion, self quarantine at home away from others to avoid spread of disease, discussed means of transmission, and possible complications including pneumonia.  If worse or not improving within the next 4-5 days, then call or return.  Patient voiced understanding of diagnosis, recommendations, and treatment plan.  After visit summary given.  Gave note for work.

## 2018-07-16 NOTE — Patient Instructions (Addendum)
Thank you for giving me the opportunity to serve you today.   Your diagnosis today includes: Encounter Diagnoses  Name Primary?  . Influenza Yes  . Flu-like symptoms     Medications prescribed today: Tamiflu  Specific home care recommendations today include:  Only take over-the-counter (OTC) or prescription medicines for pain, discomfort, or fever as directed by your caregiver.    Decongestant: You may use OTC Guaifenesin (Mucinex plain) for congestion.  You may use Pseudoephedrine (Sudafed) only if you don't have blood pressure problems or a diagnosis of hypertension.  Cough suppression: If you have cough from drainage, you may use over-the-counter Dextromethorphan (Delsym) as directed on the label  Sore throat remedies:  You may use salt water gargles, warm fluids such as coffee or hot tea, or honey/tea/lemon mixture to sooth sore throat pain.  You may use OTC sore throat remedies such as Cepacol lozenges or Chloraseptic spray for sore throat pain.  Runny nose and sneezing remedies: You may use OTC antihistamine such as Zyrtec or Benadryl, but caution as these can cause drowsiness.    Pain/fever relief: You may use over-the-counter Tylenol for pain or fever  Drink extra fluids. Fluids help thin the mucus so your sinuses can drain more easily.   Applying either moist heat or ice packs to the sinus areas may help relieve discomfort.  Use saline nasal sprays to help moisten your sinuses. The sprays can be found at your local drugstore.    Please call or return if worse or not improving in the next few days.    Medication costs:  If you get to the pharmacy and medication prescribed today was either too expensive, not covered by your insurance, or required prior authorization, then please call us back to let us know.  We often have no way to know if a medication is too expensive or not covered by your insurance.  Thanks for your cooperation.   No follow-ups on file.    I have  included other useful information below for your review.   Influenza, Adult Influenza ("the flu") is a viral infection of the respiratory tract. It causes chills, fever, cough, headache, body aches, and sore throat. Influenza in general will make you feel sicker than when you have a common cold. Symptoms of the illness typically last a few days. Cough and fatigue may continue for as long as 7 to 10 days. Influenza is highly contagious. It spreads easily to others in the droplets from coughs and sneezes. People frequently become infected by touching something that was recently contaminated with the virus and then touch their mouth, nose or eyes. This infection is caused by a virus. Symptoms will not be reduced or improved by taking an antibiotic. Antibiotics are medications that kill bacteria, not viruses. DIAGNOSIS  Diagnosis of influenza is often made based on the history and physical examination as well as the presence of influenza reports occurring in your community. Testing can be done if the diagnosis is not certain. TREATMENT  Since influenza is caused by a virus, antibiotics are not helpful. Your caregiver may prescribe antiviral medicines to shorten the illness and lessen the severity. Your caregiver may also recommend influenza vaccination and/or antiviral medicines for your family members in order to prevent the spread of influenza to them. HOME CARE INSTRUCTIONS  DO NOT GIVE ASPIRIN TO PERSONS WITH INFLUENZA WHO ARE UNDER 32 YEARS OF AGE. This could lead to brain and liver damage (Reye's syndrome). Read the label on over-the-counter medicines.  Stay home from work or school if at all possible until most of your symptoms are gone.   Only take over-the-counter or prescription medicines for pain, discomfort, or fever as directed by your caregiver.   Use a cool mist humidifier to increase air moisture. This will make breathing easier.   Rest until your temperature is nearly normal: 98.6 F  (37 C). This usually takes 3 to 4 days. Be sure you get plenty of rest.   Drink at least eight, eight-ounce glasses of fluids per day. Fluids include water, juice, broth, gelatin, or lemonade.   Cover your mouth and nose when coughing or sneezing and wash your hands often to prevent the spread of this virus to other persons.  PREVENTION  Annual influenza vaccination (flu shots) is the best way to avoid getting influenza. An annual flu shot is now routinely recommended for all adults in the [place] IF:   You develop shortness of breath while resting.   You have a deep cough with production of mucous or chest pain.   You develop nausea (feeling sick to your stomach), vomiting, or diarrhea.  SEEK IMMEDIATE MEDICAL CARE IF:   You have difficulty breathing, become short of breath, or your skin or nails turn bluish.   You develop severe neck pain or stiffness.   You develop a severe headache, facial pain, or earache.   You have a fever.   You develop nausea or vomiting that cannot be controlled.  Document Released: 05/26/2000 Document Revised: 02/08/2011 Document Reviewed: 03/31/2009 [facility] Patient Information 2012 [place].

## 2018-07-18 ENCOUNTER — Ambulatory Visit
Admission: RE | Admit: 2018-07-18 | Discharge: 2018-07-18 | Disposition: A | Discharge status: Home / Self Care | Payer: Medicaid Other | Source: Ambulatory Visit | Attending: Medical | Admitting: Medical

## 2018-07-18 ENCOUNTER — Encounter: Payer: Self-pay | Admitting: Medical

## 2018-07-18 ENCOUNTER — Ambulatory Visit: Payer: Medicaid Other | Admitting: Medical

## 2018-07-18 VITALS — BP 110/70 | HR 98 | Temp 98.4°F | Resp 16 | Ht 66.0 in | Wt 131.4 lb

## 2018-07-18 DIAGNOSIS — R05 Cough: Secondary | ICD-10-CM | POA: Diagnosis not present

## 2018-07-18 DIAGNOSIS — R059 Cough, unspecified: Secondary | ICD-10-CM

## 2018-07-18 DIAGNOSIS — J111 Influenza due to unidentified influenza virus with other respiratory manifestations: Secondary | ICD-10-CM

## 2018-07-18 DIAGNOSIS — R509 Fever, unspecified: Secondary | ICD-10-CM

## 2018-07-18 LAB — BASIC METABOLIC PANEL
BUN/Creatinine Ratio: 7 — ABNORMAL LOW (ref 9–20)
BUN: 8 mg/dL (ref 6–20)
CHLORIDE: 100 mmol/L (ref 96–106)
CO2: 28 mmol/L (ref 20–29)
Calcium: 9.3 mg/dL (ref 8.7–10.2)
Creatinine, Ser: 1.17 mg/dL (ref 0.76–1.27)
GFR calc non Af Amer: 82 mL/min/{1.73_m2} (ref >59)
GFR, EST AFRICAN AMERICAN: 95 mL/min/{1.73_m2} (ref >59)
Glucose: 94 mg/dL (ref 65–99)
Potassium: 4.2 mmol/L (ref 3.5–5.2)
Sodium: 136 mmol/L (ref 134–144)

## 2018-07-18 LAB — CBC WITH DIFFERENTIAL/PLATELET
Basophils Absolute: 0 10*3/uL (ref 0.0–0.2)
Basos: 0 %
EOS (ABSOLUTE): 0 10*3/uL (ref 0.0–0.4)
EOS: 1 %
HEMOGLOBIN: 16 g/dL (ref 13.0–17.7)
Hematocrit: 45.7 % (ref 37.5–51.0)
LYMPHS ABS: 1.1 10*3/uL (ref 0.7–3.1)
Lymphs: 26 %
MCH: 29.5 pg (ref 26.6–33.0)
MCHC: 35 g/dL (ref 31.5–35.7)
MCV: 84 fL (ref 79–97)
MONOS ABS: 0.6 10*3/uL (ref 0.1–0.9)
Monocytes: 13 %
NEUTROS PCT: 60 %
Neutrophils Absolute: 2.6 10*3/uL (ref 1.4–7.0)
Platelets: 146 10*3/uL — ABNORMAL LOW (ref 150–450)
RBC: 5.42 x10E6/uL (ref 4.14–5.80)
RDW: 13.2 % (ref 11.6–15.4)
WBC: 4.3 10*3/uL (ref 3.4–10.8)

## 2018-07-18 MED ORDER — HYDROCODONE-HOMATROPINE 5-1.5 MG/5ML PO SYRP: 5.0000 mL | ORAL_SOLUTION | Freq: Three times a day (TID) | ORAL | 0 refills | Status: AC | PRN

## 2018-07-18 MED ORDER — HYDROCOD POLST-CPM POLST ER 10-8 MG/5ML PO SUER: 5.0000 mL | Freq: Two times a day (BID) | ORAL | 0 refills | Status: DC

## 2018-07-18 MED ORDER — FLUTICASONE PROPIONATE 50 MCG/ACT NA SUSP: 2.0000 | Freq: Every day | NASAL | 6 refills | Status: DC

## 2018-07-18 MED ORDER — POLYETHYLENE GLYCOL 3350 17 GM/SCOOP PO POWD: ORAL | 2 refills | Status: DC

## 2018-07-18 NOTE — Progress Notes (Signed)
   Subjective:  Luis Watson is a 33 y.o. male who presents recheck on flu.  I saw him 2 days ago.  He reports no improvement.  Cough is worse, chest hurts from coughing, not feeling well, still having sore throat, body aches, fever, chills. Taking Tamiflu without improvement, using 400mg  Ibuprofen TID.    2 days ago he reported 1 day history of cough, chest pain from the coughing, ear pain, sore throat, headache, subjective fever, body aches, chills, nausea.  No vomiting or diarrhea.  No wheezing, no shortness of breath.  He denies any sick contacts.  Currently taking nothing for his symptoms.  He is trying to hydrate well.  He is a non-smoker.  No history of asthma or lung disease  No other aggravating or relieving factors.  No other c/o.  The following portions of the patient's history were reviewed and updated as appropriate: allergies, current medications, past medical history, past social history and problem list.  ROS as in subjective   Past Medical History:  Diagnosis Date  . Acne    Current Outpatient Medications on File Prior to Visit  Medication Sig Dispense Refill  . oseltamivir (TAMIFLU) 75 MG capsule Take 1 capsule (75 mg total) by mouth 2 (two) times daily. 10 capsule 0  . valACYclovir (VALTREX) 1000 MG tablet Take 1 tablet (1,000 mg total) by mouth 3 (three) times daily. (Patient not taking: Reported on 07/16/2018) 21 tablet 0   No current facility-administered medications on file prior to visit.      Objective: BP 110/70   Pulse 98   Temp 98.4 F (36.9 C) (Oral)   Resp 16   Ht 5\' 6"  (1.676 m)   Wt 131 lb 6.4 oz (59.6 kg)   SpO2 100%   BMI 21.21 kg/m   General: Ill-appearing, well-developed, well-nourished Skin: Hot, dry HEENT: Nose inflamed and congested, clear conjunctiva, TMs pearly, no sinus tenderness, pharynx with erythema, no exudates Neck: Supple, non tender, shotty cervical adenopathy Heart: Regular rate and rhythm, normal S1, S2, no murmurs Lungs:  Clear to auscultation bilaterally, no wheezes, rales, rhonchi Abdomen: Non tender non distended Extremities: Mild generalized tenderness   Assessment: Encounter Diagnoses  Name Primary?  . Cough Yes  . Fever, unspecified fever cause   . Influenza      Plan: Labs today STAT, go for CXR C/t Tamiflu, c/t supportive care, rest, hydrate, increase to Ibuprofen 600mg  TID, and discussed that symptoms still suggest influenza illness, but need to rule out pneumonia.

## 2018-07-28 ENCOUNTER — Encounter (HOSPITAL_COMMUNITY): Payer: Self-pay | Admitting: Emergency Medicine

## 2018-07-28 ENCOUNTER — Other Ambulatory Visit: Payer: Self-pay

## 2018-07-28 ENCOUNTER — Emergency Department (HOSPITAL_COMMUNITY)
Admission: EM | Admit: 2018-07-28 | Discharge: 2018-07-28 | Disposition: A | Discharge status: Home / Self Care | Payer: Medicaid Other | Attending: Emergency Medicine | Admitting: Emergency Medicine

## 2018-07-28 ENCOUNTER — Emergency Department (HOSPITAL_COMMUNITY): Payer: Medicaid Other

## 2018-07-28 DIAGNOSIS — Z79899 Other long term (current) drug therapy: Secondary | ICD-10-CM | POA: Insufficient documentation

## 2018-07-28 DIAGNOSIS — F17210 Nicotine dependence, cigarettes, uncomplicated: Secondary | ICD-10-CM | POA: Diagnosis not present

## 2018-07-28 DIAGNOSIS — B9789 Other viral agents as the cause of diseases classified elsewhere: Secondary | ICD-10-CM

## 2018-07-28 DIAGNOSIS — J069 Acute upper respiratory infection, unspecified: Secondary | ICD-10-CM | POA: Insufficient documentation

## 2018-07-28 DIAGNOSIS — R05 Cough: Secondary | ICD-10-CM | POA: Diagnosis present

## 2018-07-28 MED ORDER — BENZONATATE 100 MG PO CAPS: 100.0000 mg | ORAL_CAPSULE | Freq: Three times a day (TID) | ORAL | 0 refills | Status: DC

## 2018-07-28 MED ORDER — ALBUTEROL SULFATE HFA 108 (90 BASE) MCG/ACT IN AERS
1.0000 | INHALATION_SPRAY | Freq: Once | RESPIRATORY_TRACT | Status: AC
  Administered 2018-07-28: 1 via RESPIRATORY_TRACT
  Filled 2018-07-28: qty 6.7

## 2018-07-28 MED ORDER — SODIUM CHLORIDE 0.9 % IV BOLUS
1000.0000 mL | Freq: Once | INTRAVENOUS | Status: AC
  Administered 2018-07-28: 1000 mL via INTRAVENOUS

## 2018-07-28 MED ORDER — ACETAMINOPHEN 500 MG PO TABS
1000.0000 mg | ORAL_TABLET | Freq: Once | ORAL | Status: AC
  Administered 2018-07-28: 1000 mg via ORAL
  Filled 2018-07-28: qty 2

## 2018-07-28 NOTE — ED Provider Notes (Signed)
[facility]-EMERGENCY DEPT Provider Note   CSN: [id number] Arrival date & time: 07/28/18  1120     History   Chief Complaint Chief Complaint  Patient presents with  . Cough  . Otalgia    L  . Fever  . Generalized Body Aches    HPI Luis Watson is a 33 y.o. male who presents to ED for evaluation of 3-week history of cough productive with mucus, left-sided ear pain, subjective fever, generalized body aches, shortness of breath.  He waited by his PCP on 07/18/2018, negative flu test, chest x-ray and lab work and was placed on Tamiflu.  He took the entire course of Tamiflu but continues to have a cough.  He was given an antitussive but states that "I think I am allergic to it, it made my throat hurt."  HPI  Past Medical History:  Diagnosis Date  . Acne     Patient Active Problem List   Diagnosis Date Noted  . Screening for condition 09/13/2017  . Vaccine counseling 09/13/2017  . Varicose veins of both lower extremities 09/13/2017  . Hemorrhoids 09/13/2017    Past Surgical History:  Procedure Laterality Date  . SOFT TISSUE CYST EXCISION  2011   chest        Home Medications    Prior to Admission medications   Medication Sig Start Date End Date Taking? Authorizing Provider  dextromethorphan-guaiFENesin (MUCINEX DM) 30-600 MG 12hr tablet Take 1 tablet by mouth 2 (two) times daily as needed for cough.   Yes [provider]  ibuprofen (ADVIL,MOTRIN) 200 MG tablet Take 400 mg by mouth every 6 (six) hours as needed for fever or mild pain.   Yes [provider]  benzonatate (TESSALON) 100 MG capsule Take 1 capsule (100 mg total) by mouth every 8 (eight) hours. 07/28/18   Jairo Bellew, PA-C  chlorpheniramine-HYDROcodone (TUSSIONEX PENNKINETIC ER) 10-8 MG/5ML SUER Take 5 mLs by mouth 2 (two) times daily. Patient not taking: Reported on 07/28/2018 07/18/18   Tysinger, Kermit Baloavid S, PA-C  oseltamivir (TAMIFLU) 75 MG capsule Take 1 capsule (75 mg  total) by mouth 2 (two) times daily. Patient not taking: Reported on 07/28/2018 07/16/18   Tysinger, Kermit Baloavid S, PA-C  valACYclovir (VALTREX) 1000 MG tablet Take 1 tablet (1,000 mg total) by mouth 3 (three) times daily. Patient not taking: Reported on 07/28/2018 03/06/18   Joselyn ArrowKnapp, Eve, MD    Family History Family History  Problem Relation Age of Onset  . Cancer Mother        lung  . Asthma Father   . Diabetes Neg Hx   . Heart disease Neg Hx   . Hyperlipidemia Neg Hx   . Hypertension Neg Hx   . Stroke Neg Hx     Social History Social History   Tobacco Use  . Smoking status: Light Tobacco Smoker    Packs/day: 0.25    Years: 2.00    Pack years: 0.50    Types: Cigarettes  . Smokeless tobacco: Never Used  Substance Use Topics  . Alcohol use: Yes    Alcohol/week: 1.0 standard drinks    Types: 1 Standard drinks or equivalent per week    Comment: occasional  . Drug use: No     Allergies   Patient has no known allergies.   Review of Systems Review of Systems  Constitutional: Positive for chills. Negative for appetite change and fever.  HENT: Positive for ear pain and rhinorrhea. Negative for sneezing and sore throat.  Eyes: Negative for photophobia and visual disturbance.  Respiratory: Positive for cough and shortness of breath. Negative for chest tightness and wheezing.   Cardiovascular: Negative for chest pain and palpitations.  Gastrointestinal: Negative for abdominal pain, blood in stool, constipation, diarrhea, nausea and vomiting.  Genitourinary: Negative for dysuria, hematuria and urgency.  Musculoskeletal: Positive for myalgias.  Skin: Negative for rash.  Neurological: Negative for dizziness, weakness and light-headedness.     Physical Exam Updated Vital Signs BP 109/70 (BP Location: Left Arm)   Pulse 88   Temp 99.5 F (37.5 C) (Oral)   Resp 16   Ht 5\' 5"  (1.651 m)   Wt 59.4 kg   SpO2 100%   BMI 21.80 kg/m   Physical Exam Vitals signs and nursing note  reviewed.  Constitutional:      General: He is not in acute distress.    Appearance: He is well-developed.  HENT:     Head: Normocephalic and atraumatic.     Right Ear: A middle ear effusion is present. Tympanic membrane is erythematous.     Left Ear: A middle ear effusion is present. Tympanic membrane is erythematous.     Nose: Nose normal.     Mouth/Throat:     Pharynx: Oropharynx is clear. Uvula midline.  Eyes:     General: No scleral icterus.       Right eye: No discharge.        Left eye: No discharge.     Conjunctiva/sclera: Conjunctivae normal.  Neck:     Musculoskeletal: Normal range of motion and neck supple.  Cardiovascular:     Rate and Rhythm: Regular rhythm. Tachycardia present.     Heart sounds: Normal heart sounds. No murmur. No friction rub. No gallop.   Pulmonary:     Effort: Pulmonary effort is normal. No respiratory distress.     Breath sounds: Normal breath sounds.  Abdominal:     General: Bowel sounds are normal. There is no distension.     Palpations: Abdomen is soft.     Tenderness: There is no abdominal tenderness. There is no guarding.  Musculoskeletal: Normal range of motion.     Comments: No lower extremity edema, erythema or calf tenderness bilaterally.  Skin:    General: Skin is warm and dry.     Findings: No rash.  Neurological:     Mental Status: He is alert.     Motor: No abnormal muscle tone.     Coordination: Coordination normal.      ED Treatments / Results  Labs (all labs ordered are listed, but only abnormal results are displayed) Labs Reviewed - No data to display  EKG EKG Interpretation  Date/Time:  Sunday July 28 2018 13:48:58 EST Ventricular Rate:  80 PR Interval:    QRS Duration: 102 QT Interval:  377 QTC Calculation: 435 R Axis:   79 Text Interpretation:  Sinus rhythm since last tracing no significant change Confirmed by Mancel Bale [phone]) on 07/28/2018 1:53:54 PM   Radiology Dg Chest 2 View  Result Date:  07/28/2018 CLINICAL DATA:  3-4 week history of cough, headaches, fever and LEFT ear pain. EXAM: CHEST - 2 VIEW COMPARISON:  07/18/2018 and earlier. FINDINGS: Cardiomediastinal silhouette unremarkable and unchanged. Lungs clear. Bronchovascular markings normal. Pulmonary vascularity normal. No visible pleural effusions. No pneumothorax. Visualized bony thorax intact. Central peribronchial thickening IMPRESSION: No acute cardiopulmonary disease.  Stable examination. Electronically Signed   By: Hulan Saas M.D.   On: 07/28/2018 13:33  Procedures Procedures (including critical care time)  Medications Ordered in ED Medications  albuterol (PROVENTIL HFA;VENTOLIN HFA) 108 (90 Base) MCG/ACT inhaler 1 puff (has no administration in time range)  sodium chloride 0.9 % bolus 1,000 mL (1,000 mLs Intravenous New Bag/Given 07/28/18 1311)  acetaminophen (TYLENOL) tablet 1,000 mg (1,000 mg Oral Given 07/28/18 1310)     Initial Impression / Assessment and Plan / ED Course  I have reviewed the triage vital signs and the nursing notes.  Pertinent labs & imaging results that were available during my care of the patient were reviewed by me and considered in my medical decision making (see chart for details).  Clinical Course as of Jul 28 1424  Sun Jul 28, 2018  1130 Pulse Rate(!): 116 [HK]  1352 Pulse Rate: 88 [HK]    Clinical Course User Index [HK] Dietrich Pates, PA-C    33 year old male presents to ED for persistent cough productive with mucus, left-sided ear pain, subjective fever, generalized body aches and shortness of breath.  Seen by his PCP on 2/6 with negative flu test, chest x-ray and lab.  He was placed on Tamiflu.  No improvement with Tamiflu course.  Patient tachycardic here.  Lungs are clear to auscultation bilaterally.  He has no lower extremity edema, erythema or calf tenderness that would concern me for DVT.  EKG shows sinus rhythm.  Chest x-ray is unremarkable.  Patient given fluids,  Tylenol and albuterol here with improvement in his symptoms.  Suspect that symptoms are viral in nature.  Tachycardia has improved with fluids as I feel this could be due to mild dehydration.  He is able to tolerate p.o. intake without difficulty prior to discharge.  Doubt PE as the cause of his symptoms based on his vital and improvement here. We will have him follow-up with PCP, give antitussives and home with albuterol inhaler.  Patient is hemodynamically stable, in NAD, and able to ambulate in the ED. Evaluation does not show pathology that would require ongoing emergent intervention or inpatient treatment. I explained the diagnosis to the patient. Pain has been managed and has no complaints prior to discharge. Patient is comfortable with above plan and is stable for discharge at this time. All questions were answered prior to disposition. Strict return precautions for returning to the ED were discussed. Encouraged follow up with PCP.    Portions of this note were generated with Scientist, clinical (histocompatibility and immunogenetics). Dictation errors may occur despite best attempts at proofreading.   Final Clinical Impressions(s) / ED Diagnoses   Final diagnoses:  Viral URI with cough    ED Discharge Orders         Ordered    benzonatate (TESSALON) 100 MG capsule  Every 8 hours     07/28/18 1400           Dietrich Pates, PA-C 07/28/18 1426    Mancel Bale, MD 07/29/18 1441

## 2018-07-28 NOTE — Discharge Instructions (Signed)
Use the inhaler as needed to help with shortness of breath. Use the cough medicine to help with your cough. Follow-up with your primary care provider. Return to the ED for worsening symptoms, shortness of breath, chest pain, vomiting or coughing up blood.

## 2018-07-28 NOTE — ED Triage Notes (Signed)
Pt c/o fever, cough and L ear pain. Pt states cough has been ongoing x 3-4 weeks and pt gets headache with cough. Pt reports cough is productive. Fevers continue. Pt saw PMD, negative flu, CXR WNL.

## 2018-07-28 NOTE — ED Notes (Signed)
ED Provider at bedside. 

## 2018-07-28 NOTE — ED Notes (Signed)
Patient transported to X-ray 

## 2018-10-06 ENCOUNTER — Emergency Department (HOSPITAL_COMMUNITY)
Admission: EM | Admit: 2018-10-06 | Discharge: 2018-10-06 | Discharge status: Absent without leave | Payer: Medicaid Other

## 2018-10-06 ENCOUNTER — Other Ambulatory Visit: Payer: Self-pay

## 2018-12-28 ENCOUNTER — Other Ambulatory Visit: Payer: Self-pay | Admitting: Internal Medicine

## 2018-12-28 DIAGNOSIS — Z20822 Contact with and (suspected) exposure to covid-19: Secondary | ICD-10-CM

## 2019-01-01 LAB — NOVEL CORONAVIRUS, NAA: SARS-CoV-2, NAA: NOT DETECTED

## 2019-04-01 ENCOUNTER — Other Ambulatory Visit: Payer: Self-pay

## 2019-04-01 ENCOUNTER — Encounter: Payer: Self-pay | Admitting: Medical

## 2019-04-01 ENCOUNTER — Ambulatory Visit: Payer: Medicaid Other | Admitting: Medical

## 2019-04-01 VITALS — BP 100/78 | HR 70 | Temp 96.0°F | Ht 66.0 in | Wt 135.6 lb

## 2019-04-01 DIAGNOSIS — L709 Acne, unspecified: Secondary | ICD-10-CM | POA: Diagnosis not present

## 2019-04-01 DIAGNOSIS — Z0000 Encounter for general adult medical examination without abnormal findings: Secondary | ICD-10-CM | POA: Diagnosis not present

## 2019-04-01 DIAGNOSIS — Z23 Encounter for immunization: Secondary | ICD-10-CM | POA: Diagnosis not present

## 2019-04-01 MED ORDER — TRETINOIN 0.05 % EX CREA: TOPICAL_CREAM | Freq: Every day | CUTANEOUS | 0 refills | Status: DC

## 2019-04-01 NOTE — Progress Notes (Signed)
Subjective:   HPI  Luis Watson is a 33 y.o. male who presents for Chief Complaint  Patient presents with  . Annual Exam    fasting wants flu shot     Patient Care Team: Faisal Stradling, Kermit Baloavid S, PA-C as PCP - General (Family Medicine) Sees dentist Sees eye doctor  Concerns: Doing well.  His only complaint he has an acne bump on his left shoulder that is inflamed and will not resolve.  Exercising regularly, going to [employer].  Eating healthy foods.  Otherwise in normal state of health  Reviewed their medical, surgical, family, social, medication, and allergy history and updated chart as appropriate.  Past Medical History:  Diagnosis Date  . Acne     Past Surgical History:  Procedure Laterality Date  . SOFT TISSUE CYST EXCISION  2011   chest    Social History   Socioeconomic History  . Marital status: Married    Spouse name: Not on file  . Number of children: Not on file  . Years of education: Not on file  . Highest education level: Not on file  Occupational History  . Not on file  Social Needs  . Financial resource strain: Not on file  . Food insecurity    Worry: Not on file    Inability: Not on file  . Transportation needs    Medical: Not on file    Non-medical: Not on file  Tobacco Use  . Smoking status: Former Smoker    Packs/day: 0.25    Years: 2.00    Pack years: 0.50    Types: Cigarettes    Quit date: 02/19/2019    Years since quitting: 0.1  . Smokeless tobacco: Never Used  Substance and Sexual Activity  . Alcohol use: Yes    Alcohol/week: 1.0 standard drinks    Types: 1 Standard drinks or equivalent per week    Comment: occasional  . Drug use: No  . 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 Physical activity    Days per week: Not on file    Minutes per session: Not on file  . Stress: Not on file  Relationships  . Social Musicianconnections    Talks on phone: Not on  file    Gets together: Not on file    Attends religious service: Not on file    Active member of club or organization: Not on file    Attends meetings of clubs or organizations: Not on file    Relationship status: Not on file  . Intimate partner violence    Fear of current or ex partner: Not on file    Emotionally abused: Not on file    Physically abused: Not on file    Forced sexual activity: Not on file  Other Topics Concern  . Not on file  Social History Narrative   Married, 8yo son, 2yo son.  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  03/2019    Family History  Problem Relation Age of Onset  . Cancer Mother        lung  . Asthma Father   . Diabetes Neg Hx   . Heart disease Neg Hx   . Hyperlipidemia Neg Hx   . Hypertension Neg Hx   . Stroke Neg Hx      Current Outpatient Medications:  .  benzonatate (TESSALON) 100  MG capsule, Take 1 capsule (100 mg total) by mouth every 8 (eight) hours. (Patient not taking: Reported on 04/01/2019), Disp: 21 capsule, Rfl: 0 .  chlorpheniramine-HYDROcodone (TUSSIONEX PENNKINETIC ER) 10-8 MG/5ML SUER, Take 5 mLs by mouth 2 (two) times daily. (Patient not taking: Reported on 07/28/2018), Disp: 140 mL, Rfl: 0 .  dextromethorphan-guaiFENesin (MUCINEX DM) 30-600 MG 12hr tablet, Take 1 tablet by mouth 2 (two) times daily as needed for cough., Disp: , Rfl:  .  ibuprofen (ADVIL,MOTRIN) 200 MG tablet, Take 400 mg by mouth every 6 (six) hours as needed for fever or mild pain., Disp: , Rfl:  .  oseltamivir (TAMIFLU) 75 MG capsule, Take 1 capsule (75 mg total) by mouth 2 (two) times daily. (Patient not taking: Reported on 07/28/2018), Disp: 10 capsule, Rfl: 0 .  tretinoin (RETIN-A) 0.05 % cream, Apply topically at bedtime., Disp: 45 g, Rfl: 0 .  valACYclovir (VALTREX) 1000 MG tablet, Take 1 tablet (1,000 mg total) by mouth 3 (three) times daily. (Patient not taking: Reported on 07/28/2018), Disp: 21 tablet, Rfl: 0  No  Known Allergies     Review of Systems Constitutional: -fever, -chills, -sweats, -unexpected weight change, -decreased appetite, -fatigue Allergy: -sneezing, -itching, -congestion Dermatology: -changing moles, --rash, -lumps ENT: -runny nose, -ear pain, -sore throat, -hoarseness, -sinus pain, -teeth pain, - ringing in ears, -hearing loss, -nosebleeds Cardiology: -chest pain, -palpitations, -swelling, -difficulty breathing when lying flat, -waking up short of breath Respiratory: -cough, -shortness of breath, -difficulty breathing with exercise or exertion, -wheezing, -coughing up blood Gastroenterology: -abdominal pain, -nausea, -vomiting, -diarrhea, -constipation, -blood in stool, -changes in bowel movement, -difficulty swallowing or eating Hematology: -bleeding, -bruising  Musculoskeletal: -joint aches, -muscle aches, -joint swelling, -back pain, -neck pain, -cramping, -changes in gait Ophthalmology: denies vision changes, eye redness, itching, discharge Urology: -burning with urination, -difficulty urinating, -blood in urine, -urinary frequency, -urgency, -incontinence Neurology: -headache, -weakness, -tingling, -numbness, -memory loss, -falls, -dizziness Psychology: -depressed mood, -agitation, -sleep problems Male GU: no testicular mass, pain, no lymph nodes swollen, no swelling, no rash.     Objective:  BP 100/78   Pulse 70   Temp (!) 96 F (35.6 C)   Ht 5\' 6"  (1.676 m)   Wt 135 lb 9.6 oz (61.5 kg)   SpO2 97%   BMI 21.89 kg/m   General appearance: alert, no distress, WD/WN, male Skin: Left shoulder anterior superior with 3 to 4 mm raised comedone, other scattered small comedones, no other worrisome lesion HEENT: normocephalic, conjunctiva/corneas normal, sclerae anicteric, PERRLA, EOMi, nares patent, no discharge or erythema, pharynx normal Oral cavity: MMM, tongue normal, teeth normal Neck: supple, no lymphadenopathy, no thyromegaly, no masses, normal ROM, no  bruits Chest: non tender, normal shape and expansion Heart: RRR, normal S1, S2, no murmurs Lungs: CTA bilaterally, no wheezes, rhonchi, or rales Abdomen: +bs, soft, non tender, non distended, no masses, no hepatomegaly, no splenomegaly, no bruits Back: non tender, normal ROM, no scoliosis Musculoskeletal: upper extremities non tender, no obvious deformity, normal ROM throughout, lower extremities non tender, no obvious deformity, normal ROM throughout Extremities: no edema, no cyanosis, no clubbing Pulses: 2+ symmetric, upper and lower extremities, normal cap refill Neurological: alert, oriented x 3, CN2-12 intact, strength normal upper extremities and lower extremities, sensation normal throughout, DTRs 2+ throughout, no cerebellar signs, gait normal Psychiatric: normal affect, behavior normal, pleasant  GU: normal male external genitalia, uncircumcised, nontender, no masses, no hernia, no lymphadenopathy Rectal: Deferred   Assessment and Plan :   Encounter Diagnoses  Name Primary?  . Encounter for health maintenance examination in adult Yes  . Acne, unspecified acne type   . Need for influenza vaccination     Physical exam - discussed and counseled on healthy lifestyle, diet, exercise, preventative care, vaccinations, sick and well care, proper use of emergency dept and after hours care, and addressed their concerns.    Health screening: See your eye doctor yearly for routine vision care. See your dentist yearly for routine dental care including hygiene visits twice yearly.  Cancer screening Advised monthly self testicular exam   Vaccinations: Advised yearly influenza vaccine Counseled on the influenza virus vaccine.  Vaccine information sheet given.  Influenza vaccine given after consent obtained.  Separate significant chronic issues discussed: Acne-can use Retin-A topically for specific lesions likely on his shoulder we discussed today, not full facial cream or not  throughout skin.  Overall his acne looks pretty good not really been an issue of late  Luis Watson was seen today for annual exam.  Diagnoses and all orders for this visit:  Encounter for health maintenance examination in adult -     Comprehensive metabolic panel -     CBC -     Lipid panel  Acne, unspecified acne type  Need for influenza vaccination -     Flu Vaccine QUAD 6+ mos PF IM (Fluarix Quad PF)  Other orders -     tretinoin (RETIN-A) 0.05 % cream; Apply topically at bedtime.    Follow-up pending labs, yearly for physical

## 2019-04-02 LAB — COMPREHENSIVE METABOLIC PANEL
ALT: 16 IU/L (ref 0–44)
AST: 19 IU/L (ref 0–40)
Albumin/Globulin Ratio: 1.7 (ref 1.2–2.2)
Albumin: 4.8 g/dL (ref 4.0–5.0)
Alkaline Phosphatase: 71 IU/L (ref 39–117)
BUN/Creatinine Ratio: 15 (ref 9–20)
BUN: 18 mg/dL (ref 6–20)
Bilirubin Total: 0.5 mg/dL (ref 0.0–1.2)
CO2: 25 mmol/L (ref 20–29)
Calcium: 9.6 mg/dL (ref 8.7–10.2)
Chloride: 107 mmol/L — ABNORMAL HIGH (ref 96–106)
Creatinine, Ser: 1.22 mg/dL (ref 0.76–1.27)
GFR calc Af Amer: 90 mL/min/{1.73_m2} (ref >59)
GFR calc non Af Amer: 78 mL/min/{1.73_m2} (ref >59)
Globulin, Total: 2.8 g/dL (ref 1.5–4.5)
Glucose: 94 mg/dL (ref 65–99)
Potassium: 4.5 mmol/L (ref 3.5–5.2)
Sodium: 148 mmol/L — ABNORMAL HIGH (ref 134–144)
Total Protein: 7.6 g/dL (ref 6.0–8.5)

## 2019-04-02 LAB — LIPID PANEL
Chol/HDL Ratio: 3.1 ratio (ref 0.0–5.0)
Cholesterol, Total: 179 mg/dL (ref 100–199)
HDL: 58 mg/dL (ref >39)
LDL Chol Calc (NIH): 96 mg/dL (ref 0–99)
Triglycerides: 142 mg/dL (ref 0–149)
VLDL Cholesterol Cal: 25 mg/dL (ref 5–40)

## 2019-04-02 LAB — CBC
Hematocrit: 44.6 % (ref 37.5–51.0)
Hemoglobin: 15.3 g/dL (ref 13.0–17.7)
MCH: 29 pg (ref 26.6–33.0)
MCHC: 34.3 g/dL (ref 31.5–35.7)
MCV: 85 fL (ref 79–97)
Platelets: 205 10*3/uL (ref 150–450)
RBC: 5.27 x10E6/uL (ref 4.14–5.80)
RDW: 13 % (ref 11.6–15.4)
WBC: 4.4 10*3/uL (ref 3.4–10.8)

## 2019-04-18 ENCOUNTER — Other Ambulatory Visit: Payer: Self-pay | Admitting: Medical

## 2019-04-18 MED ORDER — CLINDAMYCIN PHOS-BENZOYL PEROX 1-5 % EX GEL: CUTANEOUS | 0 refills | Status: DC

## 2019-04-23 ENCOUNTER — Telehealth: Payer: Self-pay | Admitting: Medical

## 2019-04-23 MED ORDER — CLINDAMYCIN PHOS-BENZOYL PEROX 1.2-5 % EX GEL: CUTANEOUS | 0 refills | Status: DC

## 2019-04-23 NOTE — Telephone Encounter (Signed)
I changed pt to generic Duac per Medicaid preferred guidelines and ok per Shane.  Pt informed  °

## 2019-04-23 NOTE — Telephone Encounter (Signed)
I changed pt to generic Duac per Medicaid preferred guidelines and ok per [facility].  Pt informed

## 2019-04-23 NOTE — Telephone Encounter (Signed)
Pt called that ins not covering Benzaclin.  Called [place] [employer] [phone] PY#[id number] L, covered alternative Duac only difference is 1.2% verses 1.0%, ok to switch per Audelia Acton.  Called into pharmacy and went thru for $3, they have to order and will be in tomorrow.  Called pt and informed

## 2019-07-09 ENCOUNTER — Telehealth: Payer: Self-pay | Admitting: Medical

## 2019-07-09 NOTE — Telephone Encounter (Signed)
Please refer to dermatology, Terri Piedra

## 2019-07-09 NOTE — Telephone Encounter (Signed)
Patient has been referred.  

## 2020-02-12 ENCOUNTER — Telehealth (INDEPENDENT_AMBULATORY_CARE_PROVIDER_SITE_OTHER): Payer: Medicaid Other | Admitting: Medical

## 2020-02-12 ENCOUNTER — Encounter: Payer: Self-pay | Admitting: Medical

## 2020-02-12 ENCOUNTER — Other Ambulatory Visit: Payer: Self-pay

## 2020-02-12 VITALS — Ht 65.0 in | Wt 138.0 lb

## 2020-02-12 DIAGNOSIS — R490 Dysphonia: Secondary | ICD-10-CM

## 2020-02-12 DIAGNOSIS — J029 Acute pharyngitis, unspecified: Secondary | ICD-10-CM

## 2020-02-12 LAB — POCT INFLUENZA A/B
Influenza A, POC: NEGATIVE
Influenza B, POC: NEGATIVE

## 2020-02-12 LAB — POC COVID19 BINAXNOW: SARS Coronavirus 2 Ag: NEGATIVE

## 2020-02-12 LAB — POCT RAPID STREP A (OFFICE): Rapid Strep A Screen: NEGATIVE

## 2020-02-12 MED ORDER — HYDROCODONE-ACETAMINOPHEN 5-325 MG PO TABS: 1.0000 | ORAL_TABLET | Freq: Four times a day (QID) | ORAL | 0 refills | Status: DC | PRN

## 2020-02-12 MED ORDER — PREDNISONE 20 MG PO TABS: ORAL_TABLET | ORAL | 0 refills | Status: DC

## 2020-02-12 NOTE — Patient Instructions (Addendum)
  Thank you for giving me the opportunity to serve you today.   Your diagnosis today includes: Encounter Diagnoses  Name Primary?  . Sore throat Yes  . Hoarse voice quality    Your test results show negative for rapid antigen Covid test and Strep swab negative as well. We will send the PCR covid swab for confirmation per protocol  Thus, it appears your condition is viral pharyngitis.   This requires no antibiotics.  Treatment is supportive to help deal with symptoms as below   Specific home care recommendations today include:  Only take over-the-counter (OTC) or prescription medicines for pain, discomfort, or fever as directed by your caregiver.    Sore throat remedies:  You may use salt water gargles, warm fluids such as coffee or hot tea, or honey/tea/lemon mixture to sooth sore throat pain.  You may use OTC sore throat remedies such as Cepacol lozenges or Chloraseptic spray for sore throat pain.  Pain/fever relief: You may use over-the-counter Tylenol for pain or fever  Alternately, you may use over-the-counter Tylenol 325 mg every 4 hours or ibuprofen 2 to 3 tablets at a time which is equivalent to 400-600 mg of ibuprofen 3 times a day for the next few days.  Drink extra fluids. Fluids help thin the mucus so your sinuses can drain more easily.   Unfortunately part of the treatment for this is resting the voice when possible   Please call or return if worse or not improving in the next few days.

## 2020-02-12 NOTE — Progress Notes (Addendum)
Documentation for virtual audio and video telecommunications through Zoom encounter:  The patient was located in his car The provider was located in the office. The patient did consent to this visit and is aware of possible charges through their insurance for this visit.  The other persons participating in this telemedicine service were none. Time spent on call was 15 minutes and in review of previous records >15 minutes total.  This virtual service is not related to other E/M service within previous 7 days.   Subjective:  Luis Watson is a 34 y.o. male who presents for Chief Complaint  Patient presents with  . Sore Throat    loss of voice      Visit today for sore throat, loss of voice.  This was a modified visit as we did part virtual in part testing in the parking lot here.  He notes 1.5 day history of painful throat, sore throat, hoarse voice.  He really does not have any other symptoms.  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  He denies cough, no body aches, no chills, no fever, no ear pain, no sinus pain.  No change in smell or taste.  No nausea.  No sick contacts.  Has had covid vaccine.  He has already tried salt water gargles and Tylenol but that does not seem to be helping.  No other aggravating or relieving factors.    No other c/o.  The following portions of the patient's history were reviewed and updated as appropriate: allergies, current medications, past family history, past medical history, past social history, past surgical history and problem list.  ROS Otherwise as in subjective above    Objective: Ht 5\' 5"  (1.651 m)   Wt 138 lb (62.6 kg)   BMI 22.96 kg/m   Patient was not seen in person given the virtual consult part of this visit General appearance: alert, no distress, well developed, well nourished     Assessment: Encounter Diagnoses  Name Primary?  . Sore throat Yes  . Hoarse voice quality       Plan: Discussed symptoms and concerns.   We discussed the following and gave handout to patient:   Your test results show negative for rapid antigen Covid test and Strep swab negative as well. We will send the PCR covid swab for confirmation per protocol  Thus, it appears your condition is viral pharyngitis.   This requires no antibiotics.  Treatment is supportive to help deal with symptoms as below  Will try round of steroid and pain medication as below since he was persistent about trying something.  I reiterated that this appears to be a viral syndrome and there is no specific treatment to make this clear up any faster   Specific home care recommendations today include:  Only take over-the-counter (OTC) or prescription medicines for pain, discomfort, or fever as directed by your caregiver.    Sore throat remedies:  You may use salt water gargles, warm fluids such as coffee or hot tea, or honey/tea/lemon mixture to sooth sore throat pain.  You may use OTC sore throat remedies such as Cepacol lozenges or Chloraseptic spray for sore throat pain.  Pain/fever relief: You may use over-the-counter Tylenol for pain or fever  Drink extra fluids. Fluids help thin the mucus so your sinuses can drain more easily.   Unfortunately part of the treatment for this is resting the voice when possible   Please  call or return if worse or not improving in the next few days.      Luis Watson was seen today for sore throat.  Diagnoses and all orders for this visit:  Sore throat -     Novel Coronavirus, NAA ([facility]) -     Rapid Strep A -     POC COVID-19 -     Influenza A/B  Hoarse voice quality -     Novel Coronavirus, NAA ([facility]) -     POC COVID-19 -     Influenza A/B  Other orders -     HYDROcodone-acetaminophen (NORCO) 5-325 MG tablet; Take 1 tablet by mouth every 6 (six) hours as needed. -     predniSONE (DELTASONE) 20 MG tablet; 2 tablets daily for 5 days    Follow up: prn

## 2020-02-13 LAB — NOVEL CORONAVIRUS, NAA: SARS-CoV-2, NAA: NOT DETECTED

## 2020-06-16 ENCOUNTER — Encounter (HOSPITAL_COMMUNITY): Payer: Self-pay

## 2020-06-16 ENCOUNTER — Ambulatory Visit (HOSPITAL_COMMUNITY)
Admission: EM | Admit: 2020-06-16 | Discharge: 2020-06-16 | Disposition: A | Discharge status: Home / Self Care | Payer: Medicaid Other | Attending: Family Medicine | Admitting: Family Medicine

## 2020-06-16 DIAGNOSIS — U071 COVID-19: Secondary | ICD-10-CM | POA: Insufficient documentation

## 2020-06-16 DIAGNOSIS — R519 Headache, unspecified: Secondary | ICD-10-CM | POA: Diagnosis not present

## 2020-06-16 MED ORDER — IBUPROFEN 800 MG PO TABS: 800.0000 mg | ORAL_TABLET | Freq: Three times a day (TID) | ORAL | 0 refills | Status: DC

## 2020-06-16 NOTE — Discharge Instructions (Addendum)
You have been tested for COVID-19 today. °If your test returns positive, you will receive a phone call from [place] regarding your results. °Negative test results are not called. °Both positive and negative results area always visible on MyChart. °If you do not have a MyChart account, sign up instructions are provided in your discharge papers. °Please do not hesitate to contact us should you have questions or concerns. ° °

## 2020-06-16 NOTE — ED Triage Notes (Signed)
Pt in with c/o headache and fever tmax 100.2 that started Monday after he received his booster  Pt took tylenol for sxs with some relief

## 2020-06-17 LAB — SARS CORONAVIRUS 2 (TAT 6-24 HRS): SARS Coronavirus 2: POSITIVE — AB

## 2020-06-17 NOTE — ED Provider Notes (Signed)
[facility]   [id number] 06/16/20 Arrival Time: 1645  ASSESSMENT & PLAN:  1. Nonintractable headache, unspecified chronicity pattern, unspecified headache type     Likely beginning of virus. Discussed. COVID-19 testing sent. See letter/work note on file for self-isolation guidelines. OTC symptom care as needed. Ensure adeq hydration.  Meds ordered this encounter  Medications  . ibuprofen (ADVIL) 800 MG tablet    Sig: Take 1 tablet (800 mg total) by mouth 3 (three) times daily with meals.    Dispense:  21 tablet    Refill:  0     Follow-up Information    Jac Canavan, PA-C.   Specialty: Family Medicine Why: As needed. Contact information: [address] [place] [address] [phone]               Reviewed expectations re: course of current medical issues. Questions answered. Outlined signs and symptoms indicating need for more acute intervention. Understanding verbalized. After Visit Summary given.   SUBJECTIVE: History from: patient. Luis Watson is a 35 y.o. male who presents with worries regarding COVID-19. Known COVID-19 contact: none. Recent travel: none. Reports: temp 100.2 with HA; first noted 2 d ago. Denies: congestion and difficulty breathing. Normal PO intake without n/v/d. No extremity sensation changes or weakness.     OBJECTIVE:  Vitals:   06/16/20 1845  BP: 130/77  Pulse: 97  Resp: 20  Temp: 99.5 F (37.5 C)  SpO2: 98%    General appearance: alert; no distress Eyes: PERRLA; EOMI; conjunctiva normal HENT: [place]; AT; with nasal congestion Neck: supple  Lungs: speaks full sentences without difficulty; unlabored Extremities: no edema Skin: warm and dry Neurologic: normal gait Psychological: alert and cooperative; normal mood and affect  Labs:  Labs Reviewed  SARS CORONAVIRUS 2 (TAT 6-24 HRS)     No Known Allergies  Past Medical History:  Diagnosis Date  . Acne    Social History   Socioeconomic History   . Marital status: Married    Spouse name: Not on file  . Number of children: Not on file  . Years of education: Not on file  . Highest education level: Not on file  Occupational History  . Not on file  Tobacco Use  . Smoking status: Former Smoker    Packs/day: 0.25    Years: 2.00    Pack years: 0.50    Types: Cigarettes    Quit date: 02/19/2019    Years since quitting: 1.3  . Smokeless tobacco: Never Used  Vaping Use  . Vaping Use: Never used  Substance and Sexual Activity  . Alcohol use: Yes    Alcohol/week: 1.0 standard drink    Types: 1 Standard drinks or equivalent per week    Comment: occasional  . Drug use: No  . 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 Not on file  Social History Narrative   Married, 8yo son, 2yo son.  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 Cancer Mother  lung  . Asthma Father   . Diabetes Neg Hx   . Heart disease Neg Hx   . Hyperlipidemia Neg Hx   . Hypertension Neg Hx   . Stroke Neg Hx    Past Surgical History:  Procedure Laterality Date  . SOFT TISSUE CYST EXCISION  2011   chest     Mardella Layman, MD 06/17/20 [phone]

## 2020-06-21 ENCOUNTER — Telehealth (INDEPENDENT_AMBULATORY_CARE_PROVIDER_SITE_OTHER): Payer: Medicaid Other | Admitting: Medical

## 2020-06-21 ENCOUNTER — Other Ambulatory Visit: Payer: Self-pay

## 2020-06-21 ENCOUNTER — Telehealth: Payer: Self-pay

## 2020-06-21 ENCOUNTER — Other Ambulatory Visit (INDEPENDENT_AMBULATORY_CARE_PROVIDER_SITE_OTHER): Payer: Medicaid Other

## 2020-06-21 ENCOUNTER — Encounter: Payer: Self-pay | Admitting: Medical

## 2020-06-21 VITALS — Ht 65.0 in | Wt 138.0 lb

## 2020-06-21 DIAGNOSIS — T881XXA Other complications following immunization, not elsewhere classified, initial encounter: Secondary | ICD-10-CM

## 2020-06-21 DIAGNOSIS — R059 Cough, unspecified: Secondary | ICD-10-CM

## 2020-06-21 DIAGNOSIS — U071 COVID-19: Secondary | ICD-10-CM

## 2020-06-21 DIAGNOSIS — Z7184 Encounter for health counseling related to travel: Secondary | ICD-10-CM

## 2020-06-21 LAB — POC COVID19 BINAXNOW: SARS Coronavirus 2 Ag: NEGATIVE

## 2020-06-21 NOTE — Telephone Encounter (Signed)
Pt. Called stating he tested positive for covid on 06/15/20 and he is not having any symptoms anymore and would like to return to work. His job is requiring a negative rapid covid test before he can return to work. He wants to know if he can schedule it here for this afternoon he needs it done today.

## 2020-06-21 NOTE — Telephone Encounter (Signed)
I think our policy is at least a brief virtual to document for any testing unless we have already seen them.   I am ok with him being on schedule for test rapid, but need to do virtual

## 2020-06-21 NOTE — Progress Notes (Signed)
Called and left message.

## 2020-06-21 NOTE — Progress Notes (Unsigned)
Subjective:     Patient ID: Luis Watson, male   DOB: 1985-10-04, 35 y.o.   MRN: [id number]  This visit type was conducted due to national recommendations for restrictions regarding the COVID-19 Pandemic (e.g. social distancing) in an effort to limit this patient's exposure and mitigate transmission in our community.  Due to their co-morbid illnesses, this patient is at least at moderate risk for complications without adequate follow up.  This format is felt to be most appropriate for this patient at this time.    Documentation for virtual audio and video telecommunications through [place] encounter:  The patient was located at home. The provider was located in the office. The patient did consent to this visit and is aware of possible charges through their insurance for this visit.  The other persons participating in this telemedicine service were none. Time spent on call was 20 minutes and in review of previous records 20 minutes total.  This virtual service is not related to other E/M service within previous 7 days.   HPI Chief Complaint  Patient presents with  . Covid Positive    Patient was positive for covid 06/15/20. Not having any symptoms. Quarantined for 5 days needs a rapid for work.     Virtual consult for need of test for COVID.  He had a booster COVID-vaccine on January 2.  A few days later had 2 days of fever and headaches.  Was seen at urgent care and tested positive for COVID.  He thinks this is a reaction to the vaccine.  He only had 2 days of mild symptoms and has been fine ever since.  She has had several days of no symptoms at this point.  He needs a negative test for work to resume back to work.  He is traveling to [place] on January 28 leaving at 6 AM from [facility] airport.  He will need a test prior to departure.  He is following on [employer] [place] [employer].   He is not sure of the type of test that is acceptable.   No other aggravating or relieving factors. No other  complaint.  Review of Systems As in subjective    Objective:   Physical Exam Due to coronavirus pandemic stay at home measures, patient visit was virtual and they were not examined in person.   Ht 5\' 5"  (1.651 m)   Wt 138 lb (62.6 kg)   BMI 22.96 kg/m   Gen: nad No obvious wheezing or SOB Answers questions appropriately     Assessment:     Encounter Diagnoses  Name Primary?  . COVID-19 virus infection Yes  . Vaccination complication, initial encounter   . Travel advice encounter        Plan:     He has been symptom free for at least 5 days.  He likely had a vaccine response to the COVID booster causing the low-grade fever.  He did not have full flag symptoms of illness.  He is fine now and has been quarantined.  He will come in for rapid COVID test today.  He will get me a copy of his COVID vaccines.  He will need a letter regarding being recently tested positive and the fact that he is vaccinated today with travel at the end of this month.  He is worried that the PCR test will still show positive  We will call back to coordinate a date for testing.  We discussed that our PCR test turnaround is 2 to  3 days.  So he would likely need to come in on January 25 for a test PCR.  I advise he check with the airline to confirm which test is allowable and to specify the timeframe for testing  Kelcy was seen today for covid positive.  Diagnoses and all orders for this visit:  COVID-19 virus infection  Vaccination complication, initial encounter  Travel advice encounter    Follow-up pending callback

## 2020-06-21 NOTE — Telephone Encounter (Signed)
Pt. Called and scheduled today for a virtual with you at 2:15.

## 2020-06-22 ENCOUNTER — Telehealth: Payer: Self-pay | Admitting: Medical

## 2020-06-22 NOTE — Telephone Encounter (Signed)
    https://www.[employer].com/en/travel-alerts/requirements.html

## 2020-06-22 NOTE — Progress Notes (Signed)
Per pt he does not need a return to work note He does need a PCR for travel on the 25th

## 2020-06-25 NOTE — Telephone Encounter (Signed)
According to what I saw on the [place] [employer], he needs a PCR test within 72 hours of his flight.  So please help him to schedule a time to come in within 72 hours.  Keep in mind it takes 2 to 3 days to get the result back and if he leaves early in the morning, last time it says that we go out just under 72 hours from the time of takeoff to get the test.  He is responsible for confirming dates and scheduling an appropriate time to do the test.  If I am looking at the schedule correctly, doing the test on January 25 in the morning at 9:30 would give [place] the best opportunity for having results by the time he gets on the plane

## 2020-07-01 NOTE — Telephone Encounter (Signed)
Patient was scheduled for 07/06/20

## 2020-07-01 NOTE — Telephone Encounter (Signed)
I am sending this to [place] and Donnald Garre since Shauna is out  Please see message about scheduling him for COVID test prior to his flight

## 2020-07-06 ENCOUNTER — Other Ambulatory Visit: Payer: Medicaid Other

## 2020-07-06 ENCOUNTER — Other Ambulatory Visit: Payer: Self-pay

## 2020-07-06 ENCOUNTER — Encounter: Payer: Self-pay | Admitting: Medical

## 2020-07-06 DIAGNOSIS — Z1152 Encounter for screening for COVID-19: Secondary | ICD-10-CM

## 2020-07-08 LAB — NOVEL CORONAVIRUS, NAA

## 2020-12-23 ENCOUNTER — Encounter: Payer: Self-pay | Admitting: Medical

## 2020-12-23 ENCOUNTER — Ambulatory Visit: Payer: Medicaid Other | Admitting: Medical

## 2020-12-23 ENCOUNTER — Other Ambulatory Visit: Payer: Self-pay

## 2020-12-23 VITALS — BP 104/68 | HR 61 | Temp 96.0°F | Ht 63.25 in | Wt 141.0 lb

## 2020-12-23 DIAGNOSIS — Z1159 Encounter for screening for other viral diseases: Secondary | ICD-10-CM | POA: Diagnosis not present

## 2020-12-23 DIAGNOSIS — L989 Disorder of the skin and subcutaneous tissue, unspecified: Secondary | ICD-10-CM | POA: Diagnosis not present

## 2020-12-23 DIAGNOSIS — Z7185 Encounter for immunization safety counseling: Secondary | ICD-10-CM

## 2020-12-23 DIAGNOSIS — Z0000 Encounter for general adult medical examination without abnormal findings: Secondary | ICD-10-CM

## 2020-12-23 DIAGNOSIS — I8393 Asymptomatic varicose veins of bilateral lower extremities: Secondary | ICD-10-CM

## 2020-12-23 DIAGNOSIS — Z13220 Encounter for screening for lipoid disorders: Secondary | ICD-10-CM

## 2020-12-23 NOTE — Progress Notes (Signed)
Subjective:  Luis Watson is a 35 y.o. male who presents for Chief Complaint  Patient presents with   Annual Exam    Fasting      Medical team: Dentist No eye doctor  Here for physical  Has recurring skin lesion left shoulder.  Was prior excised by dermatology, but it has come back.  Otherwise in normal state of health  No other aggravating or relieving factors.    No other c/o.  Past Medical History:  Diagnosis Date   Acne     Family History  Problem Relation Age of Onset   Cancer Mother        lung   Asthma Father    Diabetes Neg Hx    Heart disease Neg Hx    Hyperlipidemia Neg Hx    Hypertension Neg Hx    Stroke Neg Hx     No current outpatient medications on file.  No Known Allergies   The following portions of the patient's history were reviewed and updated as appropriate: allergies, current medications, past family history, past medical history, past social history, past surgical history and problem list.  ROS Otherwise as in subjective above    Objective: BP 104/68   Pulse 61   Temp (!) 96 F (35.6 C)   Ht 5' 3.25" (1.607 m)   Wt 141 lb (64 kg)   SpO2 99%   BMI 24.78 kg/m   General appearence: alert, no distress, WD/WN, [place] male HEENT: normocephalic, sclerae anicteric, PERRLA, EOMi, nares patent, no discharge or erythema, pharynx normal Oral cavity: MMM, moderate stain at gum line, otherwise no lesions Neck: supple, no lymphadenopathy, no thyromegaly, no masses Heart: RRR, normal S1, S2, no murmurs Lungs: CTA bilaterally, no wheezes, rhonchi, or rales Abdomen: +bs, soft, non tender, non distended, no masses, no hepatomegaly, no splenomegaly Back: non tender Musculoskeletal: nontender, no swelling, no obvious deformity Extremities: no edema, no cyanosis, no clubbing Pulses: 2+ symmetric, upper and lower extremities, normal cap refill Neurological: alert, oriented x 3, CN2-12 intact, strength normal upper extremities and lower  extremities, sensation normal throughout, DTRs 2+ throughout, no cerebellar signs, gait normal Psychiatric: normal affect, behavior normal, pleasant  GU: normal male, uncir, no lesions, no lymphadenopathy Skin: upper central chest with oval scar, left anterior superior deltoid with 6 mm round slight raised pinkish lesion   Assessment: Encounter Diagnoses  Name Primary?   Encounter for health maintenance examination in adult Yes   Vaccine counseling    Varicose veins of both lower extremities, unspecified whether complicated    Skin lesion    Screening for lipid disorders    Encounter for hepatitis C screening test for low risk patient      Plan: This visit was a preventative care visit, also known as wellness visit or routine physical.   Topics typically include healthy lifestyle, diet, exercise, preventative care, vaccinations, sick and well care, proper use of emergency dept and after hours care, as well as other concerns.     Recommendations: Continue to return yearly for your annual wellness and preventative care visits.  This gives [place] a chance to discuss healthy lifestyle, exercise, vaccinations, review your chart record, and perform screenings where appropriate.  I recommend you see your eye doctor yearly for routine vision care.  I recommend you see your dentist yearly for routine dental care including hygiene visits twice yearly.   Vaccination recommendations were reviewed Immunization History  Administered Date(s) Administered   Hepatitis B 04/12/2009, 05/13/2009, 08/06/2009  Influenza,inj,Quad PF,6+ Mos 03/22/2016, 03/06/2018, 04/01/2019   Influenza-Unspecified 05/13/2009   MMR 08/06/2008   PFIZER(Purple Top)SARS-COV-2 Vaccination 08/22/2019, 09/15/2019, 06/13/2020   Td 08/06/2008, 08/06/2009   Tdap 01/27/2009, 08/26/2017   Varicella 01/27/2009   I recommend a yearly flu shot   Screening for cancer: Colon cancer screening: Age 71  Testicular cancer  screening You should do a monthly self testicular exam if you are between 12-73 years old  We discussed PSA, prostate exam, and prostate cancer screening risks/benefits.   Age 79  Skin cancer screening: Check your skin regularly for new changes, growing lesions, or other lesions of concern Come in for evaluation if you have skin lesions of concern.  Lung cancer screening: If you have a greater than 20 pack year history of tobacco use, then you may qualify for lung cancer screening with a chest CT scan.   Please call your insurance company to inquire about coverage for this test.  We currently don't have screenings for other cancers besides breast, cervical, colon, and lung cancers.  If you have a strong family history of cancer or have other cancer screening concerns, please let me know.    Bone health: Get at least 150 minutes of aerobic exercise weekly Get weight bearing exercise at least once weekly Bone density test:  A bone density test is an imaging test that uses a type of X-ray to measure the amount of calcium and other minerals in your bones. The test may be used to diagnose or screen you for a condition that causes weak or thin bones (osteoporosis), predict your risk for a broken bone (fracture), or determine how well your osteoporosis treatment is working. The bone density test is recommended for females 54 and older, or females or males <06 if certain risk factors such as thyroid disease, long term use of steroids such as for asthma or rheumatological issues, vitamin D deficiency, estrogen deficiency, family history of osteoporosis, self or family history of fragility fracture in first degree relative.    Heart health: Get at least 150 minutes of aerobic exercise weekly Limit alcohol It is important to maintain a healthy blood pressure and healthy cholesterol numbers  Heart disease screening: Screening for heart disease includes screening for blood pressure, fasting lipids,  glucose/diabetes screening, BMI height to weight ratio, reviewed of smoking status, physical activity, and diet.    Goals include blood pressure 120/80 or less, maintaining a healthy lipid/cholesterol profile, preventing diabetes or keeping diabetes numbers under good control, not smoking or using tobacco products, exercising most days per week or at least 150 minutes per week of exercise, and eating healthy variety of fruits and vegetables, healthy oils, and avoiding unhealthy food choices like fried food, fast food, high sugar and high cholesterol foods.    Other tests may possibly include EKG test, CT coronary calcium score, echocardiogram, exercise treadmill stress test.      Medical care options: I recommend you continue to seek care here first for routine care.  We try really hard to have available appointments Monday through Friday daytime hours for sick visits, acute visits, and physicals.  Urgent care should be used for after hours and weekends for significant issues that cannot wait till the next day.  The emergency department should be used for significant potentially life-threatening emergencies.  The emergency department is expensive, can often have long wait times for less significant concerns, so try to utilize primary care, urgent care, or telemedicine when possible to avoid unnecessary trips to the  emergency department.  Virtual visits and telemedicine have been introduced since the pandemic started in 2020, and can be convenient ways to receive medical care.  We offer virtual appointments as well to assist you in a variety of options to seek medical care.    Separate significant issues discussed: Skin lesion left shoulder -  discussed findings, gave options for therapy.  He agrees to cryotherapy.   Discussed risk/benefits of therapy.  Used cryotherapy to the left anterior superior lesions.  Discussed after care and time frame to see improvement.    Varicose veins - advised regular  exercise  Marquon was seen today for annual exam.  Diagnoses and all orders for this visit:  Encounter for health maintenance examination in adult -     Comprehensive metabolic panel -     CBC -     Lipid panel -     TSH -     Hepatitis C antibody  Vaccine counseling  Varicose veins of both lower extremities, unspecified whether complicated  Skin lesion  Screening for lipid disorders -     Lipid panel  Encounter for hepatitis C screening test for low risk patient -     Hepatitis C antibody    Follow up: pending labs

## 2020-12-24 LAB — LIPID PANEL
Chol/HDL Ratio: 3.7 ratio (ref 0.0–5.0)
Cholesterol, Total: 237 mg/dL — ABNORMAL HIGH (ref 100–199)
HDL: 64 mg/dL (ref >39)
LDL Chol Calc (NIH): 136 mg/dL — ABNORMAL HIGH (ref 0–99)
Triglycerides: 208 mg/dL — ABNORMAL HIGH (ref 0–149)
VLDL Cholesterol Cal: 37 mg/dL (ref 5–40)

## 2020-12-24 LAB — COMPREHENSIVE METABOLIC PANEL
ALT: 18 IU/L (ref 0–44)
AST: 16 IU/L (ref 0–40)
Albumin/Globulin Ratio: 1.6 (ref 1.2–2.2)
Albumin: 5.1 g/dL — ABNORMAL HIGH (ref 4.0–5.0)
Alkaline Phosphatase: 59 IU/L (ref 44–121)
BUN/Creatinine Ratio: 13 (ref 9–20)
BUN: 15 mg/dL (ref 6–20)
Bilirubin Total: 0.6 mg/dL (ref 0.0–1.2)
CO2: 21 mmol/L (ref 20–29)
Calcium: 9.7 mg/dL (ref 8.7–10.2)
Chloride: 101 mmol/L (ref 96–106)
Creatinine, Ser: 1.16 mg/dL (ref 0.76–1.27)
Globulin, Total: 3.1 g/dL (ref 1.5–4.5)
Glucose: 86 mg/dL (ref 65–99)
Potassium: 4.5 mmol/L (ref 3.5–5.2)
Sodium: 140 mmol/L (ref 134–144)
Total Protein: 8.2 g/dL (ref 6.0–8.5)
eGFR: 85 mL/min/{1.73_m2} (ref >59)

## 2020-12-24 LAB — HEPATITIS C ANTIBODY: Hep C Virus Ab: <0.1 s/co ratio (ref 0.0–0.9)

## 2020-12-24 LAB — CBC
Hematocrit: 46.2 % (ref 37.5–51.0)
Hemoglobin: 15.7 g/dL (ref 13.0–17.7)
MCH: 28.8 pg (ref 26.6–33.0)
MCHC: 34 g/dL (ref 31.5–35.7)
MCV: 85 fL (ref 79–97)
Platelets: 179 10*3/uL (ref 150–450)
RBC: 5.45 x10E6/uL (ref 4.14–5.80)
RDW: 13.4 % (ref 11.6–15.4)
WBC: 8.8 10*3/uL (ref 3.4–10.8)

## 2020-12-24 LAB — TSH: TSH: 1.37 u[IU]/mL (ref 0.450–4.500)

## 2020-12-24 NOTE — Patient Instructions (Signed)
This visit was a preventative care visit, also known as wellness visit or routine physical.   Topics typically include healthy lifestyle, diet, exercise, preventative care, vaccinations, sick and well care, proper use of emergency dept and after hours care, as well as other concerns.       Recommendations: Continue to return yearly for your annual wellness and preventative care visits.  This gives [place] a chance to discuss healthy lifestyle, exercise, vaccinations, review your chart record, and perform screenings where appropriate.   I recommend you see your eye doctor yearly for routine vision care.   I recommend you see your dentist yearly for routine dental care including hygiene visits twice yearly.     Vaccination recommendations were reviewed     Immunization History  Administered Date(s) Administered   Hepatitis B 04/12/2009, 05/13/2009, 08/06/2009   Influenza,inj,Quad PF,6+ Mos 03/22/2016, 03/06/2018, 04/01/2019   Influenza-Unspecified 05/13/2009   MMR 08/06/2008   PFIZER(Purple Top)SARS-COV-2 Vaccination 08/22/2019, 09/15/2019, 06/13/2020   Td 08/06/2008, 08/06/2009   Tdap 01/27/2009, 08/26/2017   Varicella 01/27/2009    I recommend a yearly flu shot     Screening for cancer: Colon cancer screening: Age 34   Testicular cancer screening You should do a monthly self testicular exam if you are between 34-57 years old   We discussed PSA, prostate exam, and prostate cancer screening risks/benefits.   Age 35   Skin cancer screening: Check your skin regularly for new changes, growing lesions, or other lesions of concern Come in for evaluation if you have skin lesions of concern.   Lung cancer screening: If you have a greater than 20 pack year history of tobacco use, then you may qualify for lung cancer screening with a chest CT scan.   Please call your insurance company to inquire about coverage for this test.   We currently don't have screenings for other cancers besides  breast, cervical, colon, and lung cancers.  If you have a strong family history of cancer or have other cancer screening concerns, please let me know.     Bone health: Get at least 150 minutes of aerobic exercise weekly Get weight bearing exercise at least once weekly Bone density test: A bone density test is an imaging test that uses a type of X-ray to measure the amount of calcium and other minerals in your bones. The test may be used to diagnose or screen you for a condition that causes weak or thin bones (osteoporosis), predict your risk for a broken bone (fracture), or determine how well your osteoporosis treatment is working. The bone density test is recommended for females 12 and older, or females or males <84 if certain risk factors such as thyroid disease, long term use of steroids such as for asthma or rheumatological issues, vitamin D deficiency, estrogen deficiency, family history of osteoporosis, self or family history of fragility fracture in first degree relative.       Heart health: Get at least 150 minutes of aerobic exercise weekly Limit alcohol It is important to maintain a healthy blood pressure and healthy cholesterol numbers   Heart disease screening: Screening for heart disease includes screening for blood pressure, fasting lipids, glucose/diabetes screening, BMI height to weight ratio, reviewed of smoking status, physical activity, and diet.     Goals include blood pressure 120/80 or less, maintaining a healthy lipid/cholesterol profile, preventing diabetes or keeping diabetes numbers under good control, not smoking or using tobacco products, exercising most days per week or at least 150 minutes per  week of exercise, and eating healthy variety of fruits and vegetables, healthy oils, and avoiding unhealthy food choices like fried food, fast food, high sugar and high cholesterol foods.     Other tests may possibly include EKG test, CT coronary calcium score,  echocardiogram, exercise treadmill stress test.         Medical care options: I recommend you continue to seek care here first for routine care.  We try really hard to have available appointments Monday through Friday daytime hours for sick visits, acute visits, and physicals.  Urgent care should be used for after hours and weekends for significant issues that cannot wait till the next day.  The emergency department should be used for significant potentially life-threatening emergencies.  The emergency department is expensive, can often have long wait times for less significant concerns, so try to utilize primary care, urgent care, or telemedicine when possible to avoid unnecessary trips to the emergency department.  Virtual visits and telemedicine have been introduced since the pandemic started in 2020, and can be convenient ways to receive medical care.  We offer virtual appointments as well to assist you in a variety of options to seek medical care.       Separate significant issues discussed: Skin lesion left shoulder -  discussed findings, gave options for therapy.  He agrees to cryotherapy.   Discussed risk/benefits of therapy.  Used cryotherapy to the left anterior superior lesions.  Discussed after care and time frame to see improvement.     Varicose veins - advised regular exercise

## 2021-01-03 IMAGING — CR DG CHEST 2V
2 series · 2 of 2 positions shown · non-contrast
Comparison: 07/18/2018 and earlier.

CLINICAL DATA: [DATE] week history of cough, headaches, fever and LEFT
ear pain.

EXAM:
CHEST - 2 VIEW

[w chest pa]
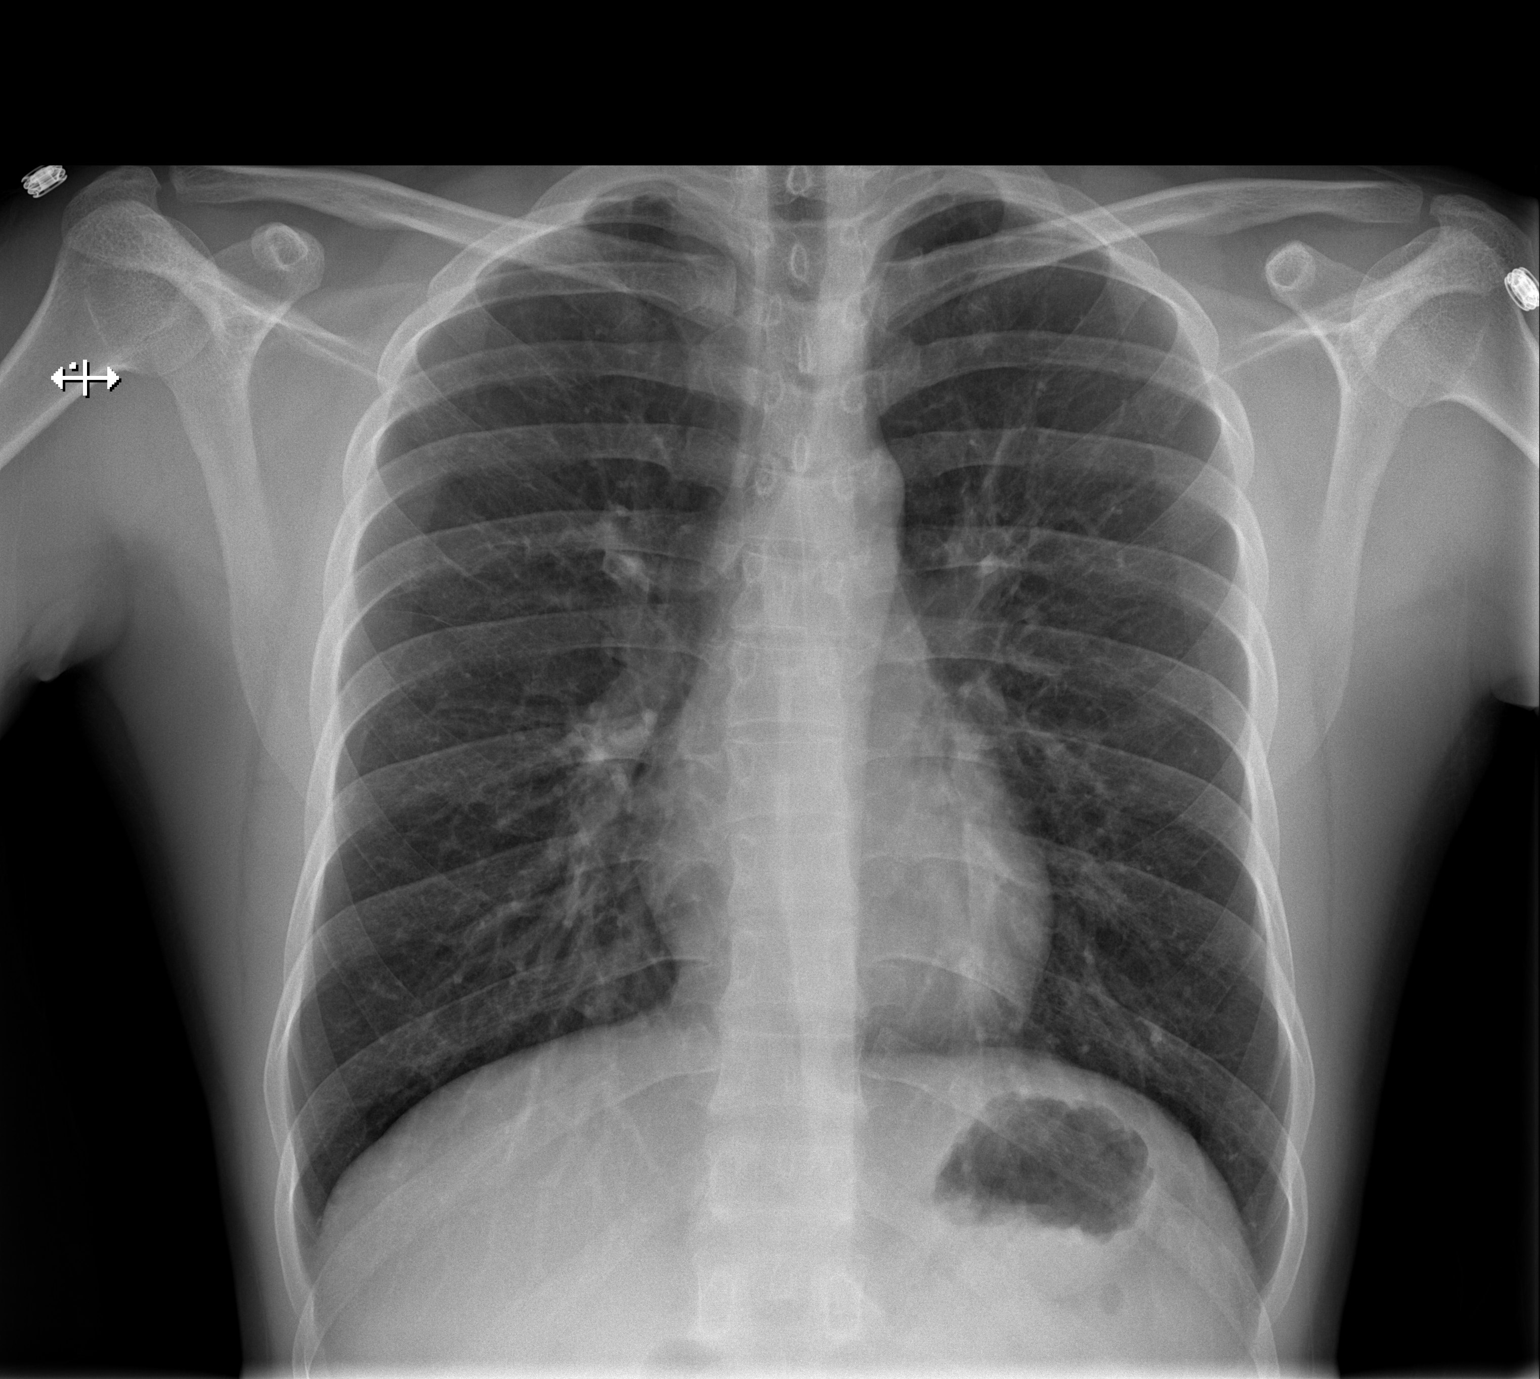

[w chest lat]
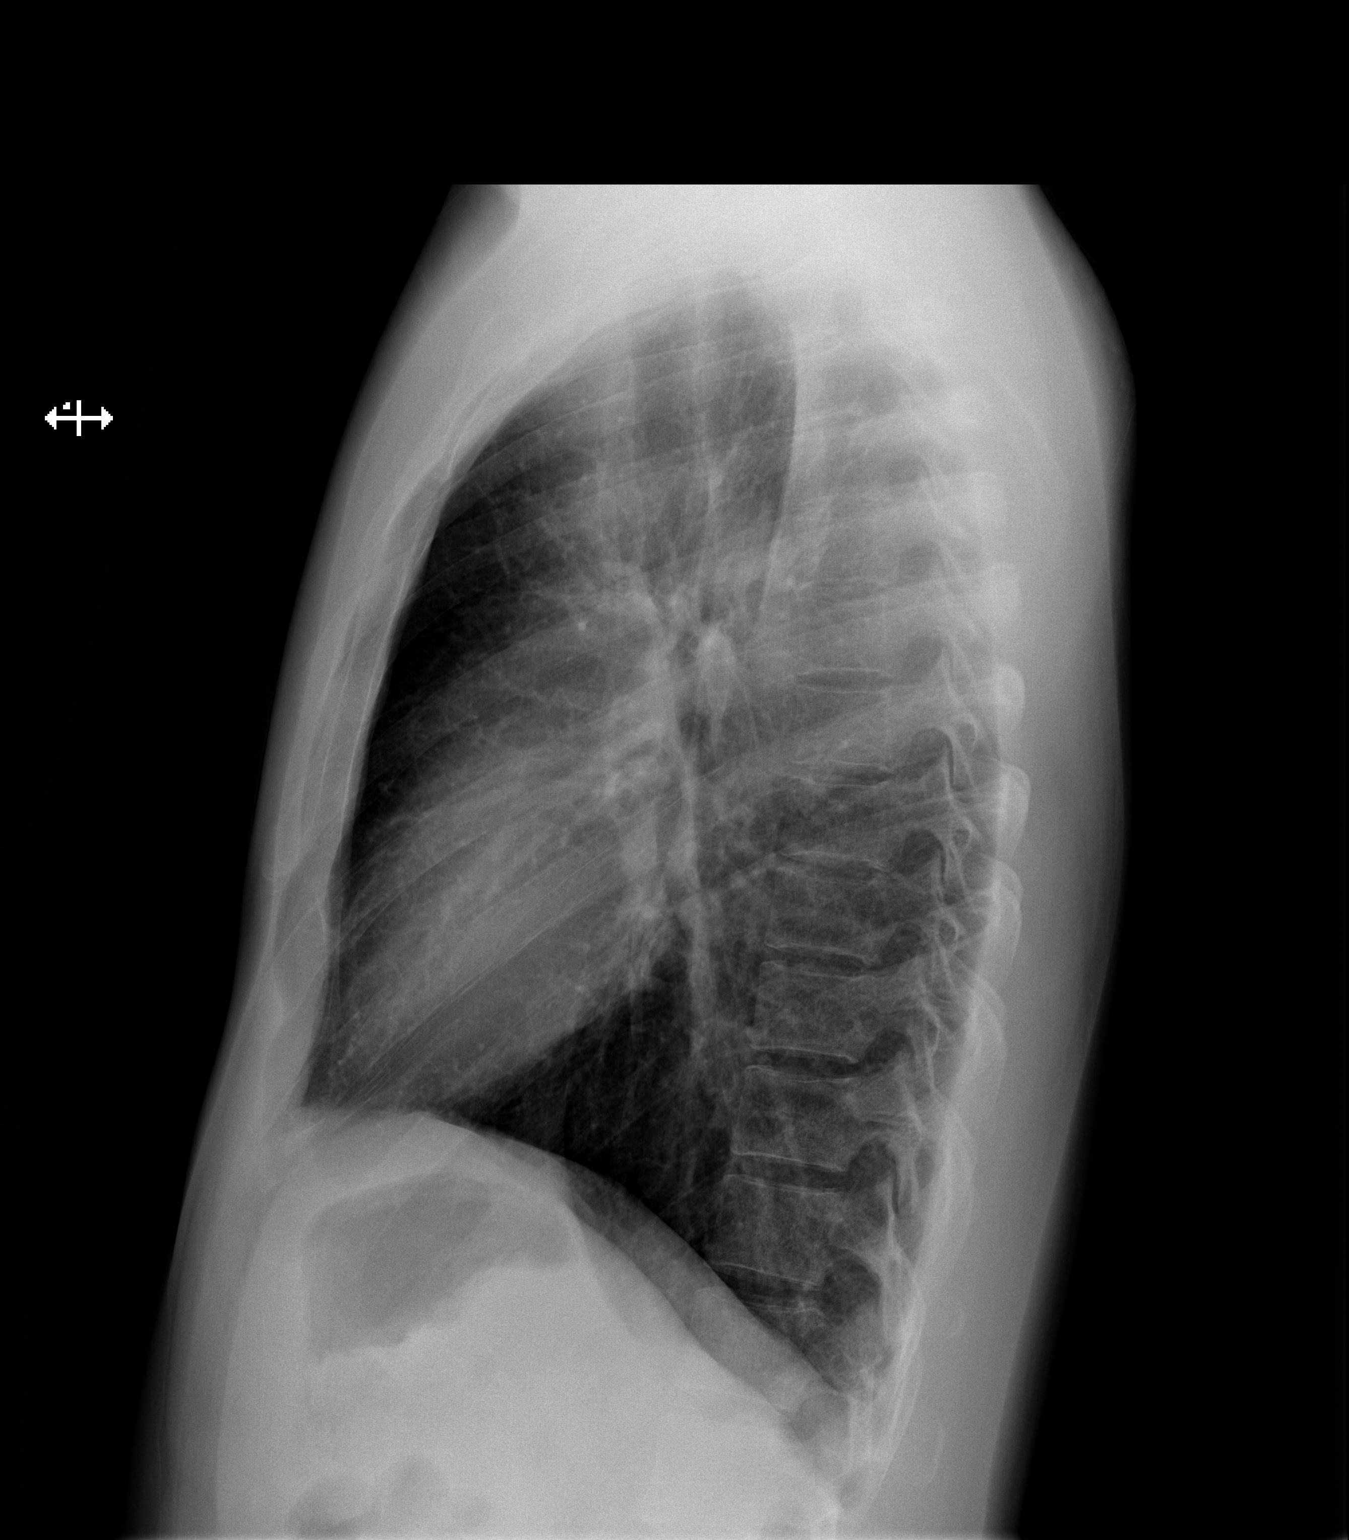

[2 of 2 positions shown; findings below may reference images not displayed]

FINDINGS: Cardiomediastinal silhouette unremarkable and unchanged. Lungs
clear. Bronchovascular markings normal. Pulmonary vascularity
normal. No visible pleural effusions. No pneumothorax. Visualized
bony thorax intact. Central peribronchial thickening
IMPRESSION: No acute cardiopulmonary disease.  Stable examination.

## 2021-04-12 ENCOUNTER — Telehealth: Payer: Medicaid Other | Admitting: Medical

## 2021-04-12 ENCOUNTER — Other Ambulatory Visit: Payer: Self-pay

## 2021-04-12 DIAGNOSIS — J209 Acute bronchitis, unspecified: Secondary | ICD-10-CM | POA: Diagnosis not present

## 2021-04-12 DIAGNOSIS — J029 Acute pharyngitis, unspecified: Secondary | ICD-10-CM | POA: Diagnosis not present

## 2021-04-12 DIAGNOSIS — R051 Acute cough: Secondary | ICD-10-CM | POA: Diagnosis not present

## 2021-04-13 NOTE — Progress Notes (Signed)
error 

## 2021-06-02 ENCOUNTER — Ambulatory Visit: Payer: Medicaid Other | Admitting: Medical

## 2021-06-02 ENCOUNTER — Other Ambulatory Visit: Payer: Self-pay

## 2021-06-02 VITALS — BP 120/80 | HR 60 | Temp 97.1°F | Wt 149.6 lb

## 2021-06-02 DIAGNOSIS — H6690 Otitis media, unspecified, unspecified ear: Secondary | ICD-10-CM | POA: Diagnosis not present

## 2021-06-02 DIAGNOSIS — Z23 Encounter for immunization: Secondary | ICD-10-CM | POA: Diagnosis not present

## 2021-06-02 DIAGNOSIS — L989 Disorder of the skin and subcutaneous tissue, unspecified: Secondary | ICD-10-CM | POA: Diagnosis not present

## 2021-06-02 DIAGNOSIS — Z7184 Encounter for health counseling related to travel: Secondary | ICD-10-CM | POA: Diagnosis not present

## 2021-06-02 MED ORDER — AMOXICILLIN 875 MG PO TABS: 875.0000 mg | ORAL_TABLET | Freq: Two times a day (BID) | ORAL | 0 refills | Status: DC

## 2021-06-02 MED ORDER — AMOXICILLIN 875 MG PO TABS: 875.0000 mg | ORAL_TABLET | Freq: Two times a day (BID) | ORAL | 0 refills | Status: AC

## 2021-06-02 NOTE — Addendum Note (Signed)
Addended by: Herminio Commons A on: 06/02/2021 02:33 PM   Modules accepted: Orders

## 2021-06-02 NOTE — Progress Notes (Signed)
Subjective:   Luis Watson is a 35 y.o. male who presents with ear pain and possible ear infection. Left ear pain, some sore throat, started yesterday.   No cough, no body aches, no chills, no fever.  No NVD.  Using nothing for symptoms.   Traveling to [place] 07/2021, has questions about vaccines and travel  Still having skin concern of left shoulder, prior excision by dermatology and prior cryotherapy here.   No other aggravating or relieving factors.  No other c/o.  Past Medical History:  Diagnosis Date   Acne    ROS as in subjective   Objective: BP 120/80    Pulse 60    Temp (!) 97.1 F (36.2 C)    Wt 149 lb 9.6 oz (67.9 kg)    BMI 26.29 kg/m    General appearance: Alert, WD/WN, no distress, mildly ill appearing                             Skin: left anterior superior deltoid with 58m relatively flat round lesions, otherwise warm, no rash                           Head: no sinus tenderness                            Eyes: conjunctiva normal, corneas clear, PERRLA                            Ears: left TM erythema, right TM normal, external ear canals normal                          Nose: septum midline, turbinates normal, with erythema and clear discharge             Mouth/throat: MMM, tongue normal, mild pharyngeal erythema                           Neck: supple, no adenopathy, no thyromegaly, nontender                               Lungs: CTA bilaterally, no wheezes, rales, or rhonchi      Assessment  Encounter Diagnoses  Name Primary?   Acute otitis media, unspecified otitis media type Yes   Travel advice encounter    Skin lesion       Plan: Otitis media - advised rest hydration, amoxicillin, and OTC tylenol sinus short term.  If not resolved within a week then recheck    Travel advice - advised hep A vaccine, updated flu shot.  He will consider other vaccines recommended by CDC by going to health dept.   He is immune to varicella, MMR per prior titers.    Reviewed vaccines on file.  He will let me know after reviewing CDC web site whether he needs malaria prophylaxis.  Skin lesion - advised he recheck with dermatology  Counseled on the influenza virus vaccine.  Vaccine information sheet given.  Influenza vaccine given after consent obtained.    Luis Watson was seen today for left ear pain.  Diagnoses and all orders for this visit:  Acute otitis media, unspecified otitis media type  Travel  advice encounter  Skin lesion  Other orders -     Discontinue: amoxicillin (AMOXIL) 875 MG tablet; Take 1 tablet (875 mg total) by mouth 2 (two) times daily for 10 days. -     amoxicillin (AMOXIL) 875 MG tablet; Take 1 tablet (875 mg total) by mouth 2 (two) times daily for 10 days.    F/u prn

## 2021-07-14 ENCOUNTER — Telehealth: Payer: Self-pay | Admitting: Internal Medicine

## 2021-07-14 ENCOUNTER — Other Ambulatory Visit: Payer: Self-pay | Admitting: Medical

## 2021-07-14 MED ORDER — AMOXICILLIN-POT CLAVULANATE 875-125 MG PO TABS: 1.0000 | ORAL_TABLET | Freq: Two times a day (BID) | ORAL | 0 refills | Status: DC

## 2021-07-14 MED ORDER — PREDNISONE 20 MG PO TABS: ORAL_TABLET | ORAL | 0 refills | Status: DC

## 2021-07-14 NOTE — Telephone Encounter (Signed)
Pt called and having ear pain still. He wanted to have another round of antibiotic sent in. He declined an appt. Hes leaving for [place] next week. [facility]

## 2021-08-23 ENCOUNTER — Telehealth: Payer: Self-pay | Admitting: Medical

## 2021-08-23 NOTE — Telephone Encounter (Signed)
Pt called to follow up on derm referral for his shoulder. He states at last appointment you were going to refer him to derm but he has not heard anything back. ? ?I did express to the patient per notes from that visit that it looks like you may have meant for patient to contact derm he had seen previously but patient is wanting you to do referral ?

## 2021-08-24 ENCOUNTER — Other Ambulatory Visit: Payer: Self-pay | Admitting: Medical

## 2021-08-24 DIAGNOSIS — L989 Disorder of the skin and subcutaneous tissue, unspecified: Secondary | ICD-10-CM

## 2021-08-24 NOTE — Telephone Encounter (Signed)
Pt was notified.  

## 2021-08-30 DIAGNOSIS — D2362 Other benign neoplasm of skin of left upper limb, including shoulder: Secondary | ICD-10-CM | POA: Diagnosis not present

## 2021-09-30 DIAGNOSIS — L910 Hypertrophic scar: Secondary | ICD-10-CM | POA: Diagnosis not present

## 2021-09-30 DIAGNOSIS — Z789 Other specified health status: Secondary | ICD-10-CM | POA: Diagnosis not present

## 2021-10-28 DIAGNOSIS — L910 Hypertrophic scar: Secondary | ICD-10-CM | POA: Diagnosis not present

## 2021-10-28 DIAGNOSIS — Z789 Other specified health status: Secondary | ICD-10-CM | POA: Diagnosis not present

## 2022-02-15 ENCOUNTER — Encounter: Payer: Self-pay | Admitting: Internal Medicine

## 2022-03-21 ENCOUNTER — Encounter: Payer: Self-pay | Admitting: Internal Medicine

## 2022-06-15 ENCOUNTER — Encounter: Payer: Self-pay | Admitting: Medical

## 2022-06-15 ENCOUNTER — Ambulatory Visit (INDEPENDENT_AMBULATORY_CARE_PROVIDER_SITE_OTHER): Payer: Medicaid Other | Admitting: Medical

## 2022-06-15 VITALS — BP 120/70 | HR 80 | Ht 64.0 in | Wt 150.0 lb

## 2022-06-15 DIAGNOSIS — I8393 Asymptomatic varicose veins of bilateral lower extremities: Secondary | ICD-10-CM

## 2022-06-15 DIAGNOSIS — Z13220 Encounter for screening for lipoid disorders: Secondary | ICD-10-CM | POA: Diagnosis not present

## 2022-06-15 DIAGNOSIS — Z0000 Encounter for general adult medical examination without abnormal findings: Secondary | ICD-10-CM | POA: Diagnosis not present

## 2022-06-15 DIAGNOSIS — Z23 Encounter for immunization: Secondary | ICD-10-CM

## 2022-06-15 DIAGNOSIS — M25532 Pain in left wrist: Secondary | ICD-10-CM | POA: Diagnosis not present

## 2022-06-15 LAB — LIPID PANEL

## 2022-06-15 LAB — POCT URINALYSIS DIP (PROADVANTAGE DEVICE)
Bilirubin, UA: NEGATIVE
Blood, UA: NEGATIVE
Glucose, UA: NEGATIVE mg/dL
Ketones, POC UA: NEGATIVE mg/dL
Leukocytes, UA: NEGATIVE
Nitrite, UA: NEGATIVE
Protein Ur, POC: NEGATIVE mg/dL
Specific Gravity, Urine: 1.01
Urobilinogen, Ur: 0.2
pH, UA: 6 (ref 5.0–8.0)

## 2022-06-15 LAB — COMPREHENSIVE METABOLIC PANEL

## 2022-06-15 NOTE — Progress Notes (Signed)
Subjective:   HPI  Luis Watson is a 37 y.o. male who presents for Chief Complaint  Patient presents with   Annual Exam    Fasting annual exam. Would like flu shot, no covid booster. Will give UA on the way out. Has been having pain in L wrist x 6-7 months, only when he lifts something heavy.     Patient Care Team: Yohanna Tow, Leward Quan as PCP - General (Family Medicine) Sees dentist Sees eye doctor  Concerns: Doing well other than some intermittent left wrist pain.  Has some pain sometimes before lifting weights.  Otherwise no pain.  No recent injury or fall.   Reviewed their medical, surgical, family, social, medication, and allergy history and updated chart as appropriate.  Past Medical History:  Diagnosis Date   Acne     Past Surgical History:  Procedure Laterality Date   SOFT TISSUE CYST EXCISION  2011   chest    Family History  Problem Relation Age of Onset   Cancer Mother        lung   Asthma Father    Diabetes Neg Hx    Heart disease Neg Hx    Hyperlipidemia Neg Hx    Hypertension Neg Hx    Stroke Neg Hx     No current outpatient medications on file.  No Known Allergies   Review of Systems Constitutional: -fever, -chills, -sweats, -unexpected weight change, -decreased appetite, -fatigue Allergy: -sneezing, -itching, -congestion Dermatology: -changing moles, --rash, -lumps ENT: -runny nose, -ear pain, -sore throat, -hoarseness, -sinus pain, -teeth pain, - ringing in ears, -hearing loss, -nosebleeds Cardiology: -chest pain, -palpitations, -swelling, -difficulty breathing when lying flat, -waking up short of breath Respiratory: -cough, -shortness of breath, -difficulty breathing with exercise or exertion, -wheezing, -coughing up blood Gastroenterology: -abdominal pain, -nausea, -vomiting, -diarrhea, -constipation, -blood in stool, -changes in bowel movement, -difficulty swallowing or eating Hematology: -bleeding, -bruising  Musculoskeletal: +joint aches,  -muscle aches, -joint swelling, -back pain, -neck pain, -cramping, -changes in gait Ophthalmology: denies vision changes, eye redness, itching, discharge Urology: -burning with urination, -difficulty urinating, -blood in urine, -urinary frequency, -urgency, -incontinence Neurology: -headache, -weakness, -tingling, -numbness, -memory loss, -falls, -dizziness Psychology: -depressed mood, -agitation, -sleep problems Male GU: no testicular mass, pain, no lymph nodes swollen, no swelling, no rash.     06/02/2021    2:05 PM 12/23/2020   12:05 PM 04/01/2019   12:03 PM 09/13/2017    9:37 AM  Depression screen PHQ 2/9  Decreased Interest 0 0 0 0  Down, Depressed, Hopeless 0 0 0 0  PHQ - 2 Score 0 0 0 0        Objective:  BP 120/70   Pulse 80   Ht _0  (1.626 m)   Wt 150 lb (68 kg)   BMI 25.75 kg/m   General appearance: alert, no distress, WD/WN, Asian male Skin: unremarkable HEENT: normocephalic, conjunctiva/corneas normal, sclerae anicteric, PERRLA, EOMi, nares patent, no discharge or erythema, pharynx normal Oral cavity: MMM, tongue normal, teeth normal Neck: supple, no lymphadenopathy, no thyromegaly, no masses, normal ROM, no bruits Chest: non tender, normal shape and expansion Heart: RRR, normal S1, S2, no murmurs Lungs: CTA bilaterally, no wheezes, rhonchi, or rales Abdomen: +bs, soft, non tender, non distended, no masses, no hepatomegaly, no splenomegaly, no bruits Back: non tender, normal ROM, no scoliosis Musculoskeletal: wrist unremarkable, otherwise upper extremities non tender, no obvious deformity, normal ROM throughout, lower extremities non tender, no obvious deformity, normal ROM throughout Extremities: no  edema, no cyanosis, no clubbing Pulses: 2+ symmetric, upper and lower extremities, normal cap refill Neurological: alert, oriented x 3, CN2-12 intact, strength normal upper extremities and lower extremities, sensation normal throughout, DTRs 2+ throughout, no  cerebellar signs, gait normal Psychiatric: normal affect, behavior normal, pleasant  GU: normal male external genitalia,uncircumcised, nontender, no masses, no hernia, no lymphadenopathy Rectal: deferred   Assessment and Plan :   Encounter Diagnoses  Name Primary?   Encounter for health maintenance examination in adult Yes   Screening for lipid disorders    Varicose veins of both lower extremities, unspecified whether complicated    Need for influenza vaccination    Wrist pain, acute, left      Recommendations: Continue to return yearly for your annual wellness and preventative care visits.  This gives [place] a chance to discuss healthy lifestyle, exercise, vaccinations, review your chart record, and perform screenings where appropriate.  I recommend you see your eye doctor yearly for routine vision care.  I recommend you see your dentist yearly for routine dental care including hygiene visits twice yearly.   Vaccination recommendations were reviewed Immunization History  Administered Date(s) Administered   Hepatitis B 04/12/2009, 05/13/2009, 08/06/2009   Influenza,inj,Quad PF,6+ Mos 03/22/2016, 03/06/2018, 04/01/2019, 06/02/2021, 06/15/2022   Influenza-Unspecified 05/13/2009   MMR 08/06/2008   PFIZER(Purple Top)SARS-COV-2 Vaccination 08/22/2019, 09/15/2019, 06/13/2020   Td 08/06/2008, 08/06/2009   Tdap 01/27/2009, 08/26/2017   Varicella 01/27/2009   Counseled on the influenza virus vaccine.  Vaccine information sheet given.  Influenza vaccine given after consent obtained.   Screening for cancer: Colon cancer screening: Age 43  Testicular cancer screening You should do a monthly self testicular exam if you are between 4-63 years old  We discussed PSA, prostate exam, and prostate cancer screening risks/benefits.   Age 35  Skin cancer screening: Check your skin regularly for new changes, growing lesions, or other lesions of concern Come in for evaluation if you have  skin lesions of concern.  Lung cancer screening: If you have a greater than 20 pack year history of tobacco use, then you may qualify for lung cancer screening with a chest CT scan.   Please call your insurance company to inquire about coverage for this test.  We currently don't have screenings for other cancers besides breast, cervical, colon, and lung cancers.  If you have a strong family history of cancer or have other cancer screening concerns, please let me know.    Bone health: Get at least 150 minutes of aerobic exercise weekly Get weight bearing exercise at least once weekly Bone density test:  A bone density test is an imaging test that uses a type of X-ray to measure the amount of calcium and other minerals in your bones. The test may be used to diagnose or screen you for a condition that causes weak or thin bones (osteoporosis), predict your risk for a broken bone (fracture), or determine how well your osteoporosis treatment is working. The bone density test is recommended for females 15 and older, or females or males <65 if certain risk factors such as thyroid disease, long term use of steroids such as for asthma or rheumatological issues, vitamin D deficiency, estrogen deficiency, family history of osteoporosis, self or family history of fragility fracture in first degree relative.    Heart health: Get at least 150 minutes of aerobic exercise weekly Limit alcohol It is important to maintain a healthy blood pressure and healthy cholesterol numbers  Heart disease screening: Screening for heart  disease includes screening for blood pressure, fasting lipids, glucose/diabetes screening, BMI height to weight ratio, reviewed of smoking status, physical activity, and diet.    Goals include blood pressure 120/80 or less, maintaining a healthy lipid/cholesterol profile, preventing diabetes or keeping diabetes numbers under good control, not smoking or using tobacco products, exercising  most days per week or at least 150 minutes per week of exercise, and eating healthy variety of fruits and vegetables, healthy oils, and avoiding unhealthy food choices like fried food, fast food, high sugar and high cholesterol foods.    Other tests may possibly include EKG test, CT coronary calcium score, echocardiogram, exercise treadmill stress test.    Medical care options: I recommend you continue to seek care here first for routine care.  We try really hard to have available appointments Monday through Friday daytime hours for sick visits, acute visits, and physicals.  Urgent care should be used for after hours and weekends for significant issues that cannot wait till the next day.  The emergency department should be used for significant potentially life-threatening emergencies.  The emergency department is expensive, can often have long wait times for less significant concerns, so try to utilize primary care, urgent care, or telemedicine when possible to avoid unnecessary trips to the emergency department.  Virtual visits and telemedicine have been introduced since the pandemic started in 2020, and can be convenient ways to receive medical care.  We offer virtual appointments as well to assist you in a variety of options to seek medical care.  Separate significant issues discussed: Tendonitis of wrist, wrist pain  Use stretching and range of motion exercises before lifting weights Consider wrist splint /carpal tunnel splint over the counter when you have some discomfort.  Use this at bed time.   You can use over the counter ibuprofen periodically if worse Consider ice if particularly bac, ice/cool therapy 15 minutes at a time   Harinder was seen today for annual exam.  Diagnoses and all orders for this visit:  Encounter for health maintenance examination in adult -     Comprehensive metabolic panel -     CBC -     Lipid panel -     POCT Urinalysis DIP (Proadvantage Device) -     HIV  Antibody (routine testing w rflx)  Screening for lipid disorders -     Lipid panel  Varicose veins of both lower extremities, unspecified whether complicated  Need for influenza vaccination -     Flu Vaccine QUAD 6+ mos PF IM (Fluarix Quad PF)  Wrist pain, acute, left    Follow-up pending labs, yearly for physical

## 2022-06-15 NOTE — Patient Instructions (Signed)
Recommendations: Continue to return yearly for your annual wellness and preventative care visits.  This gives [place] a chance to discuss healthy lifestyle, exercise, vaccinations, review your chart record, and perform screenings where appropriate.  I recommend you see your eye doctor yearly for routine vision care.  I recommend you see your dentist yearly for routine dental care including hygiene visits twice yearly.   Vaccination recommendations were reviewed Immunization History  Administered Date(s) Administered   Hepatitis B 04/12/2009, 05/13/2009, 08/06/2009   Influenza,inj,Quad PF,6+ Mos 03/22/2016, 03/06/2018, 04/01/2019, 06/02/2021   Influenza-Unspecified 05/13/2009   MMR 08/06/2008   PFIZER(Purple Top)SARS-COV-2 Vaccination 08/22/2019, 09/15/2019, 06/13/2020   Td 08/06/2008, 08/06/2009   Tdap 01/27/2009, 08/26/2017   Varicella 01/27/2009   Counseled on the influenza virus vaccine.  Vaccine information sheet given.  Influenza vaccine given after consent obtained.   Screening for cancer: Colon cancer screening: Age 76  Testicular cancer screening You should do a monthly self testicular exam if you are between 67-41 years old  We discussed PSA, prostate exam, and prostate cancer screening risks/benefits.   Age 89  Skin cancer screening: Check your skin regularly for new changes, growing lesions, or other lesions of concern Come in for evaluation if you have skin lesions of concern.  Lung cancer screening: If you have a greater than 20 pack year history of tobacco use, then you may qualify for lung cancer screening with a chest CT scan.   Please call your insurance company to inquire about coverage for this test.  We currently don't have screenings for other cancers besides breast, cervical, colon, and lung cancers.  If you have a strong family history of cancer or have other cancer screening concerns, please let me know.    Bone health: Get at least 150 minutes of  aerobic exercise weekly Get weight bearing exercise at least once weekly Bone density test:  A bone density test is an imaging test that uses a type of X-ray to measure the amount of calcium and other minerals in your bones. The test may be used to diagnose or screen you for a condition that causes weak or thin bones (osteoporosis), predict your risk for a broken bone (fracture), or determine how well your osteoporosis treatment is working. The bone density test is recommended for females 62 and older, or females or males <83 if certain risk factors such as thyroid disease, long term use of steroids such as for asthma or rheumatological issues, vitamin D deficiency, estrogen deficiency, family history of osteoporosis, self or family history of fragility fracture in first degree relative.    Heart health: Get at least 150 minutes of aerobic exercise weekly Limit alcohol It is important to maintain a healthy blood pressure and healthy cholesterol numbers  Heart disease screening: Screening for heart disease includes screening for blood pressure, fasting lipids, glucose/diabetes screening, BMI height to weight ratio, reviewed of smoking status, physical activity, and diet.    Goals include blood pressure 120/80 or less, maintaining a healthy lipid/cholesterol profile, preventing diabetes or keeping diabetes numbers under good control, not smoking or using tobacco products, exercising most days per week or at least 150 minutes per week of exercise, and eating healthy variety of fruits and vegetables, healthy oils, and avoiding unhealthy food choices like fried food, fast food, high sugar and high cholesterol foods.    Other tests may possibly include EKG test, CT coronary calcium score, echocardiogram, exercise treadmill stress test.    Medical care options: I recommend you continue to  seek care here first for routine care.  We try really hard to have available appointments Monday through Friday  daytime hours for sick visits, acute visits, and physicals.  Urgent care should be used for after hours and weekends for significant issues that cannot wait till the next day.  The emergency department should be used for significant potentially life-threatening emergencies.  The emergency department is expensive, can often have long wait times for less significant concerns, so try to utilize primary care, urgent care, or telemedicine when possible to avoid unnecessary trips to the emergency department.  Virtual visits and telemedicine have been introduced since the pandemic started in 2020, and can be convenient ways to receive medical care.  We offer virtual appointments as well to assist you in a variety of options to seek medical care.  Separate significant issues discussed: Tendonitis of wrist, wrist pain  Use stretching and range of motion exercises before lifting weights Consider wrist splint /carpal tunnel splint over the counter when you have some discomfort.  Use this at bed time.   You can use over the counter ibuprofen periodically if worse Consider ice if particularly bac, ice/cool therapy 15 minutes at a time

## 2022-06-16 LAB — COMPREHENSIVE METABOLIC PANEL
Albumin/Globulin Ratio: 1.6 (ref 1.2–2.2)
Albumin: 5.1 g/dL (ref 4.1–5.1)
Alkaline Phosphatase: 58 IU/L (ref 44–121)
BUN/Creatinine Ratio: 15 (ref 9–20)
BUN: 17 mg/dL (ref 6–20)
Bilirubin Total: 0.4 mg/dL (ref 0.0–1.2)
CO2: 23 mmol/L (ref 20–29)
Calcium: 10 mg/dL (ref 8.7–10.2)
Chloride: 99 mmol/L (ref 96–106)
Creatinine, Ser: 1.14 mg/dL (ref 0.76–1.27)
Globulin, Total: 3.1 g/dL (ref 1.5–4.5)
Glucose: 94 mg/dL (ref 70–99)
Potassium: 4.2 mmol/L (ref 3.5–5.2)
Sodium: 138 mmol/L (ref 134–144)
Total Protein: 8.2 g/dL (ref 6.0–8.5)
eGFR: 86 mL/min/{1.73_m2} (ref >59)

## 2022-06-16 LAB — LIPID PANEL
Chol/HDL Ratio: 4.6 ratio (ref 0.0–5.0)
Cholesterol, Total: 263 mg/dL — ABNORMAL HIGH (ref 100–199)
HDL: 57 mg/dL (ref >39)
LDL Chol Calc (NIH): 167 mg/dL — ABNORMAL HIGH (ref 0–99)
Triglycerides: 209 mg/dL — ABNORMAL HIGH (ref 0–149)
VLDL Cholesterol Cal: 39 mg/dL (ref 5–40)

## 2022-06-16 LAB — CBC
Hematocrit: 47.4 % (ref 37.5–51.0)
Hemoglobin: 16.1 g/dL (ref 13.0–17.7)
MCH: 28.6 pg (ref 26.6–33.0)
MCHC: 34 g/dL (ref 31.5–35.7)
MCV: 84 fL (ref 79–97)
Platelets: 186 10*3/uL (ref 150–450)
RBC: 5.63 x10E6/uL (ref 4.14–5.80)
RDW: 13 % (ref 11.6–15.4)
WBC: 4.9 10*3/uL (ref 3.4–10.8)

## 2022-06-16 LAB — HIV ANTIBODY (ROUTINE TESTING W REFLEX): HIV Screen 4th Generation wRfx: NONREACTIVE

## 2022-06-16 NOTE — Progress Notes (Signed)
Results sent through MyChart

## 2022-07-05 ENCOUNTER — Ambulatory Visit: Payer: Medicaid Other | Admitting: Medical

## 2022-07-05 VITALS — BP 110/70 | HR 72 | Temp 97.2°F | Wt 155.0 lb

## 2022-07-05 DIAGNOSIS — J988 Other specified respiratory disorders: Secondary | ICD-10-CM

## 2022-07-05 DIAGNOSIS — J029 Acute pharyngitis, unspecified: Secondary | ICD-10-CM | POA: Diagnosis not present

## 2022-07-05 DIAGNOSIS — H9202 Otalgia, left ear: Secondary | ICD-10-CM | POA: Diagnosis not present

## 2022-07-05 MED ORDER — AMOXICILLIN 875 MG PO TABS: 875.0000 mg | ORAL_TABLET | Freq: Two times a day (BID) | ORAL | 0 refills | Status: AC

## 2022-07-05 NOTE — Patient Instructions (Signed)
Symptoms currently suggest a viral cold, but you are starting to get some ear redness  Recommendations: Drink plenty of water throughout the day to help clear mucous Consider mucinex or mucinex DM for cough/congestion You can use tylenol over the counter as needed up to every 4 hours for not feeling well, aches or fever If not improving in the next 48 hours with using the remedies above, I did prescribe Amoxicillin antibiotic.   You don't have a full fledge ear infection but ear is looking a little red.

## 2022-07-05 NOTE — Progress Notes (Signed)
Subjective:  Luis Watson is a 37 y.o. male who presents for Chief Complaint  Patient presents with   ear pain    Ear pain, throat pain and burning in the chest. X 2 day     Here for ear pain, sore throat, burning in chest x 2 days.   Coughing some.   No nasal discharge, no headache or sinus pressure.   No fever, no body aches or chills.   No NVD.    No sick contacts.    Using tylenol.   No other aggravating or relieving factors.    No other c/o.  Past Medical History:  Diagnosis Date   Acne    No current outpatient medications on file prior to visit.   No current facility-administered medications on file prior to visit.     The following portions of the patient's history were reviewed and updated as appropriate: allergies, current medications, past family history, past medical history, past social history, past surgical history and problem list.  ROS Otherwise as in subjective above  Objective: BP 110/70   Pulse 72   Temp (!) 97.2 F (36.2 C)   Wt 155 lb (70.3 kg)   BMI 26.61 kg/m   General appearance: alert, no distress, well developed, well nourished HEENT: normocephalic, sclerae anicteric, conjunctiva pink and moist, left TM with mild erythema, right TM normal, nares with some erythema but no discharge, pharynx normal Oral cavity: MMM, no lesions Neck: supple, no lymphadenopathy, no thyromegaly, no masses Lungs: CTA bilaterally, no wheezes, rhonchi, or rales Abdomen: +bs, soft, non tender, non distended, no masses, no hepatomegaly, no splenomegaly    Assessment: Encounter Diagnoses  Name Primary?   Ear pain, left Yes   Sore throat    Respiratory tract infection      Plan: Discussed symptoms, exam findings, treatment recommendations as below  Patient Instructions  Symptoms currently suggest a viral cold, but you are starting to get some ear redness  Recommendations: Drink plenty of water throughout the day to help clear mucous Consider mucinex or mucinex  DM for cough/congestion You can use tylenol over the counter as needed up to every 4 hours for not feeling well, aches or fever If not improving in the next 48 hours with using the remedies above, I did prescribe Amoxicillin antibiotic.   You don't have a full fledge ear infection but ear is looking a little red.       Green was seen today for ear pain.  Diagnoses and all orders for this visit:  Ear pain, left  Sore throat  Respiratory tract infection  Other orders -     amoxicillin (AMOXIL) 875 MG tablet; Take 1 tablet (875 mg total) by mouth 2 (two) times daily for 10 days.    Follow up: prn

## 2023-04-11 ENCOUNTER — Ambulatory Visit: Payer: Medicaid Other | Admitting: Nurse Practitioner

## 2023-04-11 ENCOUNTER — Encounter: Payer: Self-pay | Admitting: Nurse Practitioner

## 2023-04-11 VITALS — BP 124/80 | HR 89 | Temp 98.7°F | Wt 151.2 lb

## 2023-04-11 DIAGNOSIS — H66002 Acute suppurative otitis media without spontaneous rupture of ear drum, left ear: Secondary | ICD-10-CM

## 2023-04-11 DIAGNOSIS — R059 Cough, unspecified: Secondary | ICD-10-CM | POA: Diagnosis not present

## 2023-04-11 DIAGNOSIS — R509 Fever, unspecified: Secondary | ICD-10-CM | POA: Diagnosis not present

## 2023-04-11 LAB — POCT INFLUENZA A/B
Influenza A, POC: NEGATIVE
Influenza B, POC: NEGATIVE

## 2023-04-11 LAB — POC COVID19 BINAXNOW: SARS Coronavirus 2 Ag: NEGATIVE

## 2023-04-11 MED ORDER — AMOXICILLIN-POT CLAVULANATE 875-125 MG PO TABS: 1.0000 | ORAL_TABLET | Freq: Two times a day (BID) | ORAL | 0 refills | Status: DC

## 2023-04-11 MED ORDER — IBUPROFEN 600 MG PO TABS: 600.0000 mg | ORAL_TABLET | Freq: Four times a day (QID) | ORAL | 0 refills | Status: DC | PRN

## 2023-04-11 NOTE — Assessment & Plan Note (Signed)
Left ear pain. Examination revealed significant infection. -Prescribe Augmentin for the infection. -Advise patient on potential eardrum rupture and subsequent drainage. If this occurs, recommend use of a cotton ball on the outside of the ear for protection and comfort.

## 2023-04-11 NOTE — Progress Notes (Signed)
Luis Eth, DNP, AGNP-c [facility] [address] [place], [place] [address] [phone]   ACUTE VISIT- ESTABLISHED PATIENT  Blood pressure 124/80, pulse 89, temperature 98.7 F (37.1 C), weight 151 lb 3.2 oz (68.6 kg), SpO2 98%.  Subjective:  HPI Luis Watson is a 37 y.o. male presents to day for evaluation of acute concern(s).   History of Present Illness Luis Watson, presents with a three-day history of fever, cough, chest pain, and left ear pain. The cough is productive with a small amount of yellow sputum. The chest pain is associated with the cough and there is no report of pain on palpation. The patient denies any nasal discharge.  The onset of symptoms was three days prior, with the fever being the first symptom noted. The patient has been self-medicating with Tylenol and ibuprofen every eight hours, but reports that these medications are not effective in managing the fever. The patient also reports occasional upset stomach.  The patient denies any wheezing or shortness of breath, and reports no known allergies. The patient has been maintaining hydration with water. The patient denies any need for a cough suppressant at night.  ROS negative except for what is listed in HPI. History, Medications, Surgery, SDOH, and Family History reviewed and updated as appropriate.  Objective:  Physical Exam Vitals and nursing note reviewed.  Constitutional:      General: He is not in acute distress.    Appearance: He is not ill-appearing or toxic-appearing.  HENT:     Head: Normocephalic.     Right Ear: Hearing and tympanic membrane normal.     Left Ear: Swelling and tenderness present. A middle ear effusion is present. Tympanic membrane is bulging.     Ears:     Comments: Significant purulent effusion present in the left ear. TM intact.     Nose: Nose normal.     Right Sinus: No maxillary sinus tenderness or frontal sinus tenderness.     Left Sinus: No maxillary sinus  tenderness or frontal sinus tenderness.     Mouth/Throat:     Mouth: Mucous membranes are moist.     Pharynx: Uvula midline. Pharyngeal swelling and posterior oropharyngeal erythema present.  Cardiovascular:     Rate and Rhythm: Regular rhythm. Tachycardia present.     Chest Wall: PMI is not displaced.     Pulses: Normal pulses.  Pulmonary:     Effort: Pulmonary effort is normal.     Breath sounds: Examination of the right-middle field reveals wheezing. Examination of the left-middle field reveals wheezing. Wheezing present.  Neurological:     Mental Status: He is alert.          Assessment & Plan:   Problem List Items Addressed This Visit     Non-recurrent acute suppurative otitis media of left ear without spontaneous rupture of tympanic membrane - Primary    Left ear pain. Examination revealed significant infection. -Prescribe Augmentin for the infection. -Advise patient on potential eardrum rupture and subsequent drainage. If this occurs, recommend use of a cotton ball on the outside of the ear for protection and comfort.      Relevant Medications   amoxicillin-clavulanate (AUGMENTIN) 875-125 MG tablet   ibuprofen (ADVIL) 600 MG tablet   Cough    Fever, cough, and chest pain for the past three days. Yellow sputum production. No nasal discharge. Wheezing noted on auscultation, but patient denies shortness of breath. -Prescribe Augmentin for the infection. -Encourage increased water intake.  Relevant Orders   POC COVID-19   Other Visit Diagnoses     Fever, unspecified fever cause       Relevant Orders   Influenza A/B        Patient instructed to return if symptoms do not improve within the next 5 days.   Luis Eth, DNP, AGNP-c

## 2023-04-11 NOTE — Patient Instructions (Signed)
I  have sent in antibiotics and ibuprofen for you. Please take all of the antibiotics to help make sure the infection goes away completely. If you are still having pain or fevers after 5 days, please let me know as we may need to change the medication.   Otitis Media With Effusion, Adult  Otitis media with effusion (OME) is inflammation and fluid (effusion) in the middle ear without having an ear infection. The middle ear is the space behind the eardrum. The middle ear is connected to the back of the throat by a narrow tube (eustachian tube). Normally the eustachian tube drains fluid out of the middle ear. A swollen eustachian tube can become blocked and cause fluid to collect in the middle ear. OME often goes away without treatment. Sometimes OME can lead to hearing problems and recurrent acute ear infections (acute otitis media). These conditions may require treatment. What are the causes? OME is caused by a blocked eustachian tube. This can result from: Allergies. Upper respiratory infections. Enlarged adenoids. The adenoids are areas of soft tissue located high in the back of the throat, behind the nose and the roof of the mouth. They are part of the body's natural defense system (immune system). Rapid changes in pressure, like when an airplane is descending or during scuba diving. In some cases, the cause of this condition is not known. What are the signs or symptoms? Common symptoms of this condition include: A feeling of fullness in your ear. Decreased hearing in the affected ear. Fluid draining into the ear canal. Pain in the ear. In some cases, there are no symptoms. How is this diagnosed?  A health care provider can diagnose OME based on signs and symptoms of the condition. Your provider will also do a physical exam to check for fluid behind the eardrum. During the exam, your health care provider will use an instrument called an otoscope to look in your ear. Your health care provider  may do other tests, such as: A hearing test. A tympanogram. This is a test that shows how well the eardrum moves in response to air pressure in the ear canal. It provides a graph for your health care provider to review. A pneumatic otoscopy. This is a test to check how your eardrum moves in response to changes in pressure. It is done by squeezing a small amount of air into the ear. How is this treated? Treatment for OME depends on the cause of the condition and the severity of symptoms. The first step is often waiting to see if the fluid drains on its own in a few weeks. Home care treatment may include: Over-the-counter pain relievers. A warm, moist cloth placed over the ear. Severe cases may require a procedure to insert tubes in the ears (tympanostomy tubes) to drain the fluid. Follow these instructions at home: Take over-the-counter and prescription medicines only as told by your health care provider. Keep all follow-up visits. Contact a health care provider if: You have pain that gets worse. Hearing in your affected ear gets worse. You have fluid draining from your ear canal. You have dizziness. You develop a fever. Get help right away if: You develop a severe headache. You completely lose hearing in the affected ear. You have bleeding from your ear canal. You have sudden and severe pain in your ear. These symptoms may represent a serious problem that is an emergency. Do not wait to see if the symptoms will go away. Get medical help right away.  Call your local emergency services (911 in the [place] Do not drive yourself to the hospital. Summary Otitis media with effusion (OME) is inflammation and fluid (effusion) in the middle ear without having an ear infection. A swollen eustachian tube can become blocked and cause fluid to collect in the middle ear. Treatment for OME depends on the cause of the condition and the severity of symptoms. Many times, treatment is not needed because the  fluid drains on its own in a few weeks. Sometimes OME can lead to hearing problems and recurrent acute ear infections (acute otitis media), which may require treatment. This information is not intended to replace advice given to you by your health care provider. Make sure you discuss any questions you have with your health care provider. Document Revised: 09/23/2020 Document Reviewed: 09/23/2020 Elsevier Patient Education  2024 [employer].

## 2023-04-11 NOTE — Assessment & Plan Note (Signed)
Fever, cough, and chest pain for the past three days. Yellow sputum production. No nasal discharge. Wheezing noted on auscultation, but patient denies shortness of breath. -Prescribe Augmentin for the infection. -Encourage increased water intake.

## 2023-04-12 ENCOUNTER — Telehealth: Payer: Self-pay | Admitting: Medical

## 2023-04-12 ENCOUNTER — Other Ambulatory Visit: Payer: Self-pay

## 2023-04-12 MED ORDER — PROMETHAZINE-DM 6.25-15 MG/5ML PO SYRP: 5.0000 mL | ORAL_SOLUTION | Freq: Three times a day (TID) | ORAL | 0 refills | Status: DC | PRN

## 2023-04-12 MED ORDER — PREDNISONE 20 MG PO TABS: 40.0000 mg | ORAL_TABLET | Freq: Every day | ORAL | 0 refills | Status: DC

## 2023-04-12 NOTE — Telephone Encounter (Signed)
Pt requesting call from nurse  States he is still having pain and fever and has blood when he coughs  He states meds not working, advised pt that it takes more time for antibiotics to work Pt wanted to speak with someone

## 2023-04-12 NOTE — Addendum Note (Signed)
Addended by: Undray Allman, Huntley Dec E on: 04/12/2023 01:26 PM   Modules accepted: Orders

## 2023-04-18 ENCOUNTER — Ambulatory Visit: Payer: Medicaid Other | Admitting: Medical

## 2023-04-18 VITALS — BP 118/80 | HR 71 | Temp 98.1°F | Resp 16 | Wt 151.6 lb

## 2023-04-18 DIAGNOSIS — R052 Subacute cough: Secondary | ICD-10-CM

## 2023-04-18 DIAGNOSIS — J988 Other specified respiratory disorders: Secondary | ICD-10-CM | POA: Diagnosis not present

## 2023-04-18 MED ORDER — HYDROCODONE BIT-HOMATROP MBR 5-1.5 MG/5ML PO SOLN
5.0000 mL | Freq: Three times a day (TID) | ORAL | 0 refills | Status: AC | PRN
Start: 2023-04-18 — End: 2023-04-23

## 2023-04-18 MED ORDER — PSEUDOEPHEDRINE HCL 60 MG PO TABS: 60.0000 mg | ORAL_TABLET | Freq: Two times a day (BID) | ORAL | 0 refills | Status: DC

## 2023-04-18 MED ORDER — AZITHROMYCIN 250 MG PO TABS
ORAL_TABLET | ORAL | 0 refills | Status: DC
Start: 2023-04-18 — End: 2023-08-21

## 2023-04-18 NOTE — Progress Notes (Signed)
Subjective: Chief Complaint  Patient presents with   Follow-up    Follow-up on sickness from last week. Ear pain still and coughing so much his chest hurts. Was advised last week per Madelaine Bhat, CMA that he could do an chest xray but never called back   Here for recheck.  He came in about a week ago for illness but has not really seen any improvement.  He still has quite a bit of cough, sore throat, ear pain, sinus pressure and sometimes feels little short of breath.  He has been taking amoxicillin, prednisone, Promethazine DM but not really responding to this.  He feels like his water intake is pretty good.  No fever, no bodyaches or chills, no hemoptysis  No recent sick contacts.  He is sleeping with the head of the bed elevated   No other aggravating or relieving factors.    No other c/o.  The following portions of the patient's history were reviewed and updated as appropriate: allergies, current medications, past family history, past medical history, past social history, past surgical history and problem list.  ROS Otherwise as in subjective above   Objective: BP 118/80   Pulse 71   Temp 98.1 F (36.7 C)   Resp 16   Wt 151 lb 9.6 oz (68.8 kg)   SpO2 97%   BMI 26.02 kg/m   General appearance: alert, no distress, well developed, well nourished HEENT: normocephalic, sclerae anicteric, conjunctiva pink and moist, TMs with air-fluid levels, no erythema, nares with mucoid discharge, + erythema, pharynx with some post nasal drainage Oral cavity: MMM, no lesions Neck: supple, no lymphadenopathy, no thyromegaly, no masses Heart: RRR, normal S1, S2, no murmurs Lungs: Coarse breath sounds, no wheezes, rhonchi, or rales   Assessment: Encounter Diagnoses  Name Primary?   Subacute cough Yes   Respiratory tract infection      Plan: Stop the current medications as he is not responding to these.  Lets begin the medications below.  Caution on sedation.  Increase water intake a bit  more as the Sudafed may drive a little bit.  Relative rest advised.  Continue elevating head of the bed until this is resolved.  If not much improved within the next 48 hours then call back.  Nabil was seen today for follow-up.  Diagnoses and all orders for this visit:  Subacute cough  Respiratory tract infection  Other orders -     azithromycin (ZITHROMAX) 250 MG tablet; 2 tablets day 1, then 1 tablet days 2-4 -     pseudoephedrine (SUDAFED) 60 MG tablet; Take 1 tablet (60 mg total) by mouth in the morning and at bedtime. -     HYDROcodone bit-homatropine (HYCODAN) 5-1.5 MG/5ML syrup; Take 5 mLs by mouth every 8 (eight) hours as needed for up to 5 days for cough.    Follow up: prn

## 2023-08-21 ENCOUNTER — Ambulatory Visit: Payer: Medicaid Other | Admitting: Medical

## 2023-08-21 VITALS — BP 120/80 | HR 84 | Ht 64.5 in | Wt 156.8 lb

## 2023-08-21 DIAGNOSIS — L989 Disorder of the skin and subcutaneous tissue, unspecified: Secondary | ICD-10-CM

## 2023-08-21 DIAGNOSIS — Z13220 Encounter for screening for lipoid disorders: Secondary | ICD-10-CM | POA: Diagnosis not present

## 2023-08-21 DIAGNOSIS — Z0000 Encounter for general adult medical examination without abnormal findings: Secondary | ICD-10-CM

## 2023-08-21 LAB — LIPID PANEL

## 2023-08-21 NOTE — Progress Notes (Signed)
 Subjective:   HPI  Luis Watson is a 38 y.o. male who presents for Chief Complaint  Patient presents with   Annual Exam    Fasting cpe, no concerns    Patient Care Team: Dexton Zwilling, Cleda Mccreedy as PCP - General (Family Medicine) Sees dentist Sees eye doctor  Concerns: Doing well   He has seen dermatology back in 2023 for a skin growth on his left deltoid.  He notes that they saw him 2 different times.  They use cryotherapy and the lesion initially went away but then it came back and they injected something into the lesion.  He has not gone away this time and he would like something done to make it go away.  It does not hurt but it is itchy from time to time.  Reviewed their medical, surgical, family, social, medication, and allergy history and updated chart as appropriate.  Past Medical History:  Diagnosis Date   Acne     Past Surgical History:  Procedure Laterality Date   SOFT TISSUE CYST EXCISION  2011   chest    Family History  Problem Relation Age of Onset   Cancer Mother        lung   Asthma Father    Diabetes Neg Hx    Heart disease Neg Hx    Hyperlipidemia Neg Hx    Hypertension Neg Hx    Stroke Neg Hx     No current outpatient medications on file.  No Known Allergies  Review of Systems  Constitutional:  Negative for chills, fever, malaise/fatigue and weight loss.  HENT:  Negative for congestion, ear pain, hearing loss, sore throat and tinnitus.   Eyes:  Negative for blurred vision, pain and redness.  Respiratory:  Negative for cough, hemoptysis and shortness of breath.   Cardiovascular:  Negative for chest pain, palpitations, orthopnea, claudication and leg swelling.  Gastrointestinal:  Negative for abdominal pain, blood in stool, constipation, diarrhea, nausea and vomiting.  Genitourinary:  Negative for dysuria, flank pain, frequency, hematuria and urgency.  Musculoskeletal:  Negative for falls, joint pain and myalgias.  Skin:  Negative for itching  and rash.  Neurological:  Negative for dizziness, tingling, speech change, weakness and headaches.  Endo/Heme/Allergies:  Negative for polydipsia. Does not bruise/bleed easily.  Psychiatric/Behavioral:  Negative for depression and memory loss. The patient is not nervous/anxious and does not have insomnia.         08/21/2023   11:36 AM 07/05/2022    9:36 AM 06/02/2021    2:05 PM 12/23/2020   12:05 PM 04/01/2019   12:03 PM  Depression screen PHQ 2/9  Decreased Interest 0 0 0 0 0  Down, Depressed, Hopeless 0 0 0 0 0  PHQ - 2 Score 0 0 0 0 0        Objective:  BP 120/80   Pulse 84   Ht 5' 4.5" (1.638 m)   Wt 156 lb 12.8 oz (71.1 kg)   BMI 26.50 kg/m   General appearance: alert, no distress, WD/WN, Asian male Skin: Left superior deltoid area with 4 to 5 mm diameter pinkish raised papular lesion, no induration, no fluctuance, no vesicle, no redness.  Tattoo of the deltoid on the left.  Otherwise skin unremarkable HEENT: normocephalic, conjunctiva/corneas normal, sclerae anicteric, PERRLA, EOMi, nares patent, no discharge or erythema, pharynx normal Oral cavity: MMM, tongue normal, teeth normal Neck: supple, no lymphadenopathy, no thyromegaly, no masses, normal ROM, no bruits Chest: non tender, normal shape and expansion  Heart: RRR, normal S1, S2, no murmurs Lungs: CTA bilaterally, no wheezes, rhonchi, or rales Abdomen: +bs, soft, non tender, non distended, no masses, no hepatomegaly, no splenomegaly, no bruits Back: non tender, normal ROM, no scoliosis Musculoskeletal:  upper extremities non tender, no obvious deformity, normal ROM throughout, lower extremities non tender, no obvious deformity, normal ROM throughout Extremities: no edema, no cyanosis, no clubbing Pulses: 2+ symmetric, upper and lower extremities, normal cap refill Neurological: alert, oriented x 3, CN2-12 intact, strength normal upper extremities and lower extremities, sensation normal throughout, DTRs 2+  throughout, no cerebellar signs, gait normal Psychiatric: normal affect, behavior normal, pleasant  GU: normal male external genitalia,uncircumcised, nontender, no masses, no hernia, no lymphadenopathy Rectal: deferred   Assessment and Plan :   Encounter Diagnoses  Name Primary?   Encounter for health maintenance examination in adult Yes   Screening for lipid disorders    Skin lesion      Recommendations: Continue to return yearly for your annual wellness and preventative care visits.  This gives [place] a chance to discuss healthy lifestyle, exercise, vaccinations, review your chart record, and perform screenings where appropriate.  I recommend you see your eye doctor yearly for routine vision care.  I recommend you see your dentist yearly for routine dental care including hygiene visits twice yearly.   Vaccination recommendations were reviewed Immunization History  Administered Date(s) Administered   Hepatitis B 04/12/2009, 05/13/2009, 08/06/2009   Influenza,inj,Quad PF,6+ Mos 03/22/2016, 03/06/2018, 04/01/2019, 06/02/2021, 06/15/2022   Influenza-Unspecified 05/13/2009   MMR 08/06/2008   PFIZER(Purple Top)SARS-COV-2 Vaccination 08/22/2019, 09/15/2019, 06/13/2020   Td 08/06/2008, 08/06/2009   Tdap 01/27/2009, 08/26/2017   Varicella 01/27/2009     Screening for cancer: Colon cancer screening: Age 53  Testicular cancer screening You should do a monthly self testicular exam if you are between 33-43 years old  We discussed PSA, prostate exam, and prostate cancer screening risks/benefits.   Age 33  Skin cancer screening: Check your skin regularly for new changes, growing lesions, or other lesions of concern Come in for evaluation if you have skin lesions of concern.  Lung cancer screening: If you have a greater than 20 pack year history of tobacco use, then you may qualify for lung cancer screening with a chest CT scan.   Please call your insurance company to inquire about  coverage for this test.  We currently don't have screenings for other cancers besides breast, cervical, colon, and lung cancers.  If you have a strong family history of cancer or have other cancer screening concerns, please let me know.    Bone health: Get at least 150 minutes of aerobic exercise weekly Get weight bearing exercise at least once weekly Bone density test:  A bone density test is an imaging test that uses a type of X-ray to measure the amount of calcium and other minerals in your bones. The test may be used to diagnose or screen you for a condition that causes weak or thin bones (osteoporosis), predict your risk for a broken bone (fracture), or determine how well your osteoporosis treatment is working. The bone density test is recommended for females 65 and older, or females or males <65 if certain risk factors such as thyroid disease, long term use of steroids such as for asthma or rheumatological issues, vitamin D deficiency, estrogen deficiency, family history of osteoporosis, self or family history of fragility fracture in first degree relative.    Heart health: Get at least 150 minutes of aerobic exercise weekly  Limit alcohol It is important to maintain a healthy blood pressure and healthy cholesterol numbers  Heart disease screening: Screening for heart disease includes screening for blood pressure, fasting lipids, glucose/diabetes screening, BMI height to weight ratio, reviewed of smoking status, physical activity, and diet.    Goals include blood pressure 120/80 or less, maintaining a healthy lipid/cholesterol profile, preventing diabetes or keeping diabetes numbers under good control, not smoking or using tobacco products, exercising most days per week or at least 150 minutes per week of exercise, and eating healthy variety of fruits and vegetables, healthy oils, and avoiding unhealthy food choices like fried food, fast food, high sugar and high cholesterol foods.     Other tests may possibly include EKG test, CT coronary calcium score, echocardiogram, exercise treadmill stress test.    Medical care options: I recommend you continue to seek care here first for routine care.  We try really hard to have available appointments Monday through Friday daytime hours for sick visits, acute visits, and physicals.  Urgent care should be used for after hours and weekends for significant issues that cannot wait till the next day.  The emergency department should be used for significant potentially life-threatening emergencies.  The emergency department is expensive, can often have long wait times for less significant concerns, so try to utilize primary care, urgent care, or telemedicine when possible to avoid unnecessary trips to the emergency department.  Virtual visits and telemedicine have been introduced since the pandemic started in 2020, and can be convenient ways to receive medical care.  We offer virtual appointments as well to assist you in a variety of options to seek medical care.  Separate significant issues discussed: Skin lesion-I will request dermatology notes and once I have those I can reach out them about whether they are willing to cut his lesion out.  He wants it removed  I reviewed the fact that his LDL cholesterol has been elevated the last 2 years.  Counseled on diet and exercise.  Discussed the possible need to use medication if his LDL does not improve with lifestyle changes  Octavia was seen today for annual exam.  Diagnoses and all orders for this visit:  Encounter for health maintenance examination in adult -     Comprehensive metabolic panel -     CBC -     Lipid panel  Screening for lipid disorders -     Lipid panel  Skin lesion    Follow-up pending labs, yearly for physical

## 2023-08-22 DIAGNOSIS — L989 Disorder of the skin and subcutaneous tissue, unspecified: Secondary | ICD-10-CM

## 2023-08-22 LAB — LIPID PANEL
Cholesterol, Total: 266 mg/dL — ABNORMAL HIGH (ref 100–199)
HDL: 52 mg/dL (ref >39)
LDL CALC COMMENT:: 5.1 ratio — ABNORMAL HIGH (ref 0.0–5.0)
LDL Chol Calc (NIH): 178 mg/dL — ABNORMAL HIGH (ref 0–99)
Triglycerides: 191 mg/dL — ABNORMAL HIGH (ref 0–149)
VLDL Cholesterol Cal: 36 mg/dL (ref 5–40)

## 2023-08-22 LAB — CBC
Hematocrit: 48.8 % (ref 37.5–51.0)
Hemoglobin: 16.3 g/dL (ref 13.0–17.7)
MCH: 28.5 pg (ref 26.6–33.0)
MCHC: 33.4 g/dL (ref 31.5–35.7)
MCV: 85 fL (ref 79–97)
Platelets: 182 10*3/uL (ref 150–450)
RBC: 5.72 x10E6/uL (ref 4.14–5.80)
RDW: 12.8 % (ref 11.6–15.4)
WBC: 4.5 10*3/uL (ref 3.4–10.8)

## 2023-08-22 LAB — COMPREHENSIVE METABOLIC PANEL
ALT: 33 IU/L (ref 0–44)
AST: 29 IU/L (ref 0–40)
Albumin: 4.6 g/dL (ref 4.1–5.1)
Alkaline Phosphatase: 62 IU/L (ref 44–121)
BUN/Creatinine Ratio: 12 (ref 9–20)
BUN: 15 mg/dL (ref 6–20)
Bilirubin Total: 0.4 mg/dL (ref 0.0–1.2)
CO2: 20 mmol/L (ref 20–29)
Calcium: 9.5 mg/dL (ref 8.7–10.2)
Chloride: 101 mmol/L (ref 96–106)
Creatinine, Ser: 1.22 mg/dL (ref 0.76–1.27)
Globulin, Total: 3.2 g/dL (ref 1.5–4.5)
Glucose: 89 mg/dL (ref 70–99)
Potassium: 3.9 mmol/L (ref 3.5–5.2)
Sodium: 139 mmol/L (ref 134–144)
Total Protein: 7.8 g/dL (ref 6.0–8.5)
eGFR: 78 mL/min/{1.73_m2} (ref >59)

## 2023-08-22 NOTE — Progress Notes (Signed)
 Results sent through MyChart

## 2023-09-05 DIAGNOSIS — L910 Hypertrophic scar: Secondary | ICD-10-CM | POA: Diagnosis not present

## 2023-09-05 DIAGNOSIS — Z789 Other specified health status: Secondary | ICD-10-CM | POA: Diagnosis not present

## 2023-10-18 ENCOUNTER — Telehealth: Admitting: Family Medicine

## 2023-10-18 ENCOUNTER — Ambulatory Visit: Payer: Self-pay

## 2023-10-18 DIAGNOSIS — J301 Allergic rhinitis due to pollen: Secondary | ICD-10-CM | POA: Diagnosis not present

## 2023-10-18 MED ORDER — LEVOCETIRIZINE DIHYDROCHLORIDE 5 MG PO TABS: 5.0000 mg | ORAL_TABLET | Freq: Every evening | ORAL | 0 refills | Status: DC

## 2023-10-18 MED ORDER — OLOPATADINE HCL 0.2 % OP SOLN: 1.0000 [drp] | Freq: Every day | OPHTHALMIC | 0 refills | Status: DC

## 2023-10-18 MED ORDER — FLUTICASONE PROPIONATE 50 MCG/ACT NA SUSP: 2.0000 | Freq: Every day | NASAL | 0 refills | Status: DC

## 2023-10-18 MED ORDER — SALINE NASAL SPRAY 0.65 % NA SOLN: 1.0000 | NASAL | 0 refills | Status: DC | PRN

## 2023-10-18 NOTE — Progress Notes (Signed)
 Virtual Visit Consent   Luis Herandez, you are scheduled for a virtual visit with a [facility] provider today. Just as with appointments in the office, your consent must be obtained to participate. Your consent will be active for this visit and any virtual visit you may have with one of our providers in the next 365 days. If you have a MyChart account, a copy of this consent can be sent to you electronically.  As this is a virtual visit, video technology does not allow for your provider to perform a traditional examination. This may limit your provider's ability to fully assess your condition. If your provider identifies any concerns that need to be evaluated in person or the need to arrange testing (such as labs, EKG, etc.), we will make arrangements to do so. Although advances in technology are sophisticated, we cannot ensure that it will always work on either your end or our end. If the connection with a video visit is poor, the visit may have to be switched to a telephone visit. With either a video or telephone visit, we are not always able to ensure that we have a secure connection.  By engaging in this virtual visit, you consent to the provision of healthcare and authorize for your insurance to be billed (if applicable) for the services provided during this visit. Depending on your insurance coverage, you may receive a charge related to this service.  I need to obtain your verbal consent now. Are you willing to proceed with your visit today? [name] has provided verbal consent on 10/18/2023 for a virtual visit (video or telephone). [name], NP  Date: 10/18/2023 1:48 PM   Virtual Visit via Video Note   I, [name], connected with  Luis Watson  ([id number], 04/29/1986) on 10/18/23 at  1:45 PM EDT by a video-enabled telemedicine application and verified that I am speaking with the correct person using two identifiers.  Location: Patient: Virtual Visit Location Patient: Home Provider:  Virtual Visit Location Provider: Home Office   I discussed the limitations of evaluation and management by telemedicine and the availability of in person appointments. The patient expressed understanding and agreed to proceed.    History of Present Illness: Luis Watson is a 38 y.o. who identifies as a male who was assigned male at birth, and is being seen today for  allergies  1 week ago with sneezing, itchy watery eyes, runny nose. No history of allergies prior to this year. Denies fever, cough, SHOB, blurry vision, or discharge from eyes or ears.  Tried using Claritin for last day without much help   Problems:  Patient Active Problem List   Diagnosis Date Noted   Non-recurrent acute suppurative otitis media of left ear without spontaneous rupture of tympanic membrane 04/11/2023   Cough 04/11/2023   Skin lesion 12/23/2020   Screening for lipid disorders 12/23/2020   Encounter for hepatitis C screening test for low risk patient 12/23/2020   Acne 04/01/2019   Need for influenza vaccination 04/01/2019   Encounter for health maintenance examination in adult 09/13/2017   Vaccine counseling 09/13/2017   Varicose veins of both lower extremities 09/13/2017   Hemorrhoids 09/13/2017    Allergies: No Known Allergies Medications: No current outpatient medications on file.  Observations/Objective: Patient is well-developed, well-nourished in no acute distress.  Resting comfortably  at home.  Head is normocephalic, atraumatic.  No labored breathing.  Speech is clear and coherent with logical content.  Patient is alert and oriented  at baseline.    Assessment and Plan:  1. Seasonal allergic rhinitis due to pollen (Primary)  - levocetirizine (XYZAL) 5 MG tablet; Take 1 tablet (5 mg total) by mouth every evening.  Dispense: 60 tablet; Refill: 0 - sodium chloride  (OCEAN) 0.65 % nasal spray; Place 1 spray into the nose as needed for congestion.  Dispense: 104 mL; Refill: 0 - Olopatadine  HCl  0.2 % SOLN; Apply 1 drop to eye daily.  Dispense: 2.5 mL; Refill: 0 - fluticasone  (FLONASE ) 50 MCG/ACT nasal spray; Place 2 sprays into both nostrils daily.  Dispense: 16 g; Refill: 0   -use medications has directed -you can develop allergies at anytime in your life -hydrate well to keep mucus thin -allergy info on AVS   Reviewed side effects, risks and benefits of medication.    Patient acknowledged agreement and understanding of the plan.   Past Medical, Surgical, Social History, Allergies, and Medications have been Reviewed.     Follow Up Instructions: I discussed the assessment and treatment plan with the patient. The patient was provided an opportunity to ask questions and all were answered. The patient agreed with the plan and demonstrated an understanding of the instructions.  A copy of instructions were sent to the patient via MyChart unless otherwise noted below.    The patient was advised to call back or seek an in-person evaluation if the symptoms worsen or if the condition fails to improve as anticipated.    [name], NP

## 2023-10-18 NOTE — Telephone Encounter (Signed)
 Reason for Disposition . [1] Nasal discharge AND [2] present > 10 days  Answer Assessment - Initial Assessment Questions 1. LOCATION: "Where does it hurt?"      Runny nose; denies pain 2. ONSET: "When did the sinus pain start?"  (e.g., hours, days)      Yellow discharge, bloody at times 3. SEVERITY: "How bad is the pain?"   (Scale 1-10; mild, moderate or severe)   - MILD (1-3): doesn't interfere with normal activities    - MODERATE (4-7): interferes with normal activities (e.g., work or school) or awakens from sleep   - SEVERE (8-10): excruciating pain and patient unable to do any normal activities        no 4. RECURRENT SYMPTOM: "Have you ever had sinus problems before?" If Yes, ask: "When was the last time?" and "What happened that time?"      yes 5. NASAL CONGESTION: "Is the nose blocked?" If Yes, ask: "Can you open it or must you breathe through your mouth?"     Runny nose 6. NASAL DISCHARGE: "Do you have discharge from your nose?" If so ask, "What color?"     Yellow green 7. FEVER: "Do you have a fever?" If Yes, ask: "What is it, how was it measured, and when did it start?"      no 8. OTHER SYMPTOMS: "Do you have any other symptoms?" (e.g., sore throat, cough, earache, difficulty breathing)     no 9. PREGNANCY: "Is there any chance you are pregnant?" "When was your last menstrual period?"     na  Protocols used: Sinus Pain or Congestion-A-AH

## 2023-10-18 NOTE — Telephone Encounter (Signed)
    Chief Complaint: eye itching  Symptoms: eye itching, runny nose, sneezing Frequency: 1 week Pertinent Negatives: Patient denies fever, cough, sob, blurry vision, eye discharge Disposition: [] ED /[] Urgent Care (no appt availability in office) / [] Appointment(In office/virtual)/ [x]  [facility]/ [] Home Care/ [] Refused Recommended Disposition /[]  Mobile Bus/ []  Follow-up with PCP Additional Notes: Patient reports he has been experiencing bilateral eye itching, runny nose, and sneezing x 1 week. Patient states he has never had this issue in the past and does not have any seasonal allergies that he is aware of. Patient has been taking OTC allergy medication with no relief. Attempted to schedule in office appt, next availability next week, so patient requesting virtual visit. VV scheduled 10/18/23. Patient advised to call back with worsening symptoms. Patient verbalized understanding      Copied From CRM [id number]. Reason for Triage: Patient is calling bc he has runny nose and sneezing, itchy eyes. [phone]  Reason for Disposition  Diagnosis of eye allergy never confirmed  Answer Assessment - Initial Assessment Questions 1. SEVERITY: "How bad is the itching?"  (e.g., Scale 1-10; mild, moderate or severe)     mild 2. ONSET: "When did the eye symptoms start?" (e.g., hours or days ago)     1 week 3. EYELIDS: "Are the eyelids swollen?" If Yes, ask: "How much?"     denies 4. EYE DISCHARGE: "Is there any discharge from the eye, or eyelid crusting?" If Yes, ask: "How much?"     denies 5. TRIGGER: "What do you think triggered the allergic reaction?" (e.g., dust, smoke, pollen, new eye make-up)     unsure 6. RECURRENT PROBLEM: "Have you experienced eye allergies before?" If Yes, ask: "When was the last time?" and "What medicine worked best in the past?"     denies 7. CONTACTS: "Do you wear contacts?"     denies 8. OTHER SYMPTOMS: "Do you have any other symptoms?" (e.g.,  runny nose)    Runny nose, sneezing,  Protocols used: Eye - Allergy-A-AH

## 2023-10-18 NOTE — Patient Instructions (Signed)
 Luis Watson, thank you for joining Luis Pion, NP for today's virtual visit.  While this provider is not your primary care provider (PCP), if your PCP is located in our provider database this encounter information will be shared with them immediately following your visit.   A [facility] MyChart account gives you access to today's visit and all your visits, tests, and labs performed at [facility] " click here if you don't have a [facility] MyChart account or go to [email or web address]  Consent: (Patient) Luis Watson provided verbal consent for this virtual visit at the beginning of the encounter.  Current Medications:  Current Outpatient Medications:    fluticasone  (FLONASE ) 50 MCG/ACT nasal spray, Place 2 sprays into both nostrils daily., Disp: 16 g, Rfl: 0   levocetirizine (XYZAL) 5 MG tablet, Take 1 tablet (5 mg total) by mouth every evening., Disp: 60 tablet, Rfl: 0   Olopatadine  HCl 0.2 % SOLN, Apply 1 drop to eye daily., Disp: 2.5 mL, Rfl: 0   sodium chloride  (OCEAN) 0.65 % nasal spray, Place 1 spray into the nose as needed for congestion., Disp: 104 mL, Rfl: 0   Medications ordered in this encounter:  Meds ordered this encounter  Medications   levocetirizine (XYZAL) 5 MG tablet    Sig: Take 1 tablet (5 mg total) by mouth every evening.    Dispense:  60 tablet    Refill:  0    Supervising Provider:   LAMPTEY, PHILIP O [[id number]]   sodium chloride  (OCEAN) 0.65 % nasal spray    Sig: Place 1 spray into the nose as needed for congestion.    Dispense:  104 mL    Refill:  0    Supervising Provider:   LAMPTEY, PHILIP O [[id number]]   Olopatadine  HCl 0.2 % SOLN    Sig: Apply 1 drop to eye daily.    Dispense:  2.5 mL    Refill:  0    Supervising Provider:   LAMPTEY, PHILIP O [id number]   fluticasone  (FLONASE ) 50 MCG/ACT nasal spray    Sig: Place 2 sprays into both nostrils daily.    Dispense:  16 g    Refill:  0    Supervising Provider:   Corine Dice  [[id number]]     *If you need refills on other medications prior to your next appointment, please contact your pharmacy*  Follow-Up: Call back or seek an in-person evaluation if the symptoms worsen or if the condition fails to improve as anticipated.   Virtual Care [phone]  Other Instructions Allergies can cause a lot of symptoms: watery, itching eyes, runny nose (clear), sneezing, sinus pressure, and headaches. This is not making you contagious to others. You can not spread to others or catch this from others. These symptoms happen after you have been exposed to something that you are allergic to an allergen. Prevention: The best prevention is to avoid the things that you know you are allergic to, for example smoke (cigarette, cigar, wood); pollens and molds; animal dander; dust mites. And indoor inhalants such as cleaning products or aerosol sprays.  Target your bedroom as allergy free by removing carpets, damp mopping floors weekly, hanging washable curtains instead of blinds, removing books and stuffed animals, using foam pillows, and encasing pillows and mattress in plastic. Do not blow your nose too frequently or too hard. It may cause your eardrum to perforate (tear). Blow through both nostrils at the same time to equalize pressure.  Use tissue when  you blow your nose. Dispose of them and then wash your hands. If no tissue is available, do the "elbow sneeze" into the bend of your arm (away from your open hands). Always wash your hands.  If able use the [facility] in the house and car to reduce exposure to pollens. Use an air filtration system in your house or buy a small one for your bedroom. Dust your house often, using a cloth and cleaner or polish that keeps the dust from flying into the air. Allergy testing can be done if you have had allergies for a long time and are not doing well on current treatments. Take your medications as directed. If you find the medications are not working  let your healthcare provider know. It might take more than one medication to control allergies, especially, seasonal ones.      If you have been instructed to have an in-person evaluation today at a local Urgent Care facility, please use the link below. It will take you to a list of all of our available [facility], including address, phone number and hours of operation. Please do not delay care.  [facility]  If you or a family member do not have a primary care provider, use the link below to schedule a visit and establish care. When you choose a [facility] primary care physician or advanced practice provider, you gain a long-term partner in health. Find a Primary Care Provider  Learn more about [facility]'s in-office and virtual care options:  - Get Care Now

## 2023-10-18 NOTE — Telephone Encounter (Addendum)
 Copied from CRM [id number]. Topic: Clinical - Pink Word Triage >> Oct 18, 2023 10:42 AM Oddis Bench wrote: Reason for Triage: Patient is calling bc he has runny nose and sneezing, itchy eyes. [phone] 1st attempt made; no answer  Chief Complaint: nasal congestion with yellow/green discharge Symptoms: see above Frequency: for awhile now Pertinent Negatives: Patient denies fever, cough, sore throat Disposition: [] ED /[x] Urgent Care (no appt availability in office) / [] Appointment(In office/virtual)/ []  [facility]/ [] Home Care/ [] Refused Recommended Disposition /[] [facility]/ []  Follow-up with PCP Additional Notes: no apt available; instructed to go to UC; care advice given, denies questions; instructed to go to ER if becomes worse.

## 2024-01-04 ENCOUNTER — Ambulatory Visit

## 2024-01-04 ENCOUNTER — Ambulatory Visit
Admission: EM | Admit: 2024-01-04 | Discharge: 2024-01-04 | Disposition: A | Discharge status: Home / Self Care | Attending: Family Medicine | Admitting: Family Medicine

## 2024-01-04 DIAGNOSIS — Z043 Encounter for examination and observation following other accident: Secondary | ICD-10-CM | POA: Diagnosis not present

## 2024-01-04 DIAGNOSIS — S91331A Puncture wound without foreign body, right foot, initial encounter: Secondary | ICD-10-CM

## 2024-01-04 MED ORDER — LEVOFLOXACIN 750 MG PO TABS: 750.0000 mg | ORAL_TABLET | Freq: Every day | ORAL | 0 refills | Status: DC

## 2024-01-04 NOTE — ED Triage Notes (Addendum)
 The patient states he stepped on a nail (right foot) and removed it yesterday. There is a puncture wound bottom of the patients right foot with no drainage noted. The patient is wanted to get the area evaluated. His last reported Tetanus immunization was in 2019.

## 2024-01-04 NOTE — Discharge Instructions (Signed)
 Keep the wound clean and dry.  Change dressing daily or as needed if it becomes soiled or saturated.  Start Levaquin antibiotic daily for 5 days.  If you start to develop achy joints or muscles please call the clinic ASAP.  Take a probiotic over-the-counter to help prevent diarrhea related to the antibiotic.  Lots of rest and fluids.  Please follow-up with your PCP if your symptoms or not improving.  Please go to the ER for any worsening symptoms.  I hope you feel better soon!

## 2024-01-04 NOTE — ED Provider Notes (Addendum)
 [facility]    CSN: [id number] Arrival date & time: 01/04/24  1053      History   Chief Complaint Chief Complaint  Patient presents with   Foot Pain    HPI Luis Watson is a 38 y.o. male presents for evaluation of a puncture wound to foot.  Patient reports yesterday while wearing shoes he stepped on a rusty nail that punctured through his shoe puncturing them plantar aspect of his right foot.  He states he removed the nail and it was intact.  States area bled but he was able to clean it and stop the bleeding.  Reports has had some redness and soreness to the area since the injury.  No drainage, fevers or chills.  No history of MRSA.  He is up-to-date on his tetanus from 2019.  No other injuries or concerns at this time   Foot Pain    Past Medical History:  Diagnosis Date   Acne     Patient Active Problem List   Diagnosis Date Noted   Non-recurrent acute suppurative otitis media of left ear without spontaneous rupture of tympanic membrane 04/11/2023   Cough 04/11/2023   Skin lesion 12/23/2020   Screening for lipid disorders 12/23/2020   Encounter for hepatitis C screening test for low risk patient 12/23/2020   Acne 04/01/2019   Need for influenza vaccination 04/01/2019   Encounter for health maintenance examination in adult 09/13/2017   Vaccine counseling 09/13/2017   Varicose veins of both lower extremities 09/13/2017   Hemorrhoids 09/13/2017    Past Surgical History:  Procedure Laterality Date   SOFT TISSUE CYST EXCISION  2011   chest       Home Medications    Prior to Admission medications   Medication Sig Start Date End Date Taking? Authorizing Provider  levofloxacin (LEVAQUIN) 750 MG tablet Take 1 tablet (750 mg total) by mouth daily. 01/04/24  Yes Dreux Mcgroarty, Jodi R, NP  fluticasone  (FLONASE ) 50 MCG/ACT nasal spray Place 2 sprays into both nostrils daily. 10/18/23   Moishe Chiquita HERO, NP  levocetirizine (XYZAL ) 5 MG tablet Take 1 tablet (5 mg total) by  mouth every evening. 10/18/23   Moishe Chiquita HERO, NP  Olopatadine  HCl 0.2 % SOLN Apply 1 drop to eye daily. 10/18/23   Moishe Chiquita HERO, NP  sodium chloride  (OCEAN) 0.65 % nasal spray Place 1 spray into the nose as needed for congestion. 10/18/23   Moishe Chiquita HERO, NP    Family History Family History  Problem Relation Age of Onset   Cancer Mother        lung   Asthma Father    Diabetes Neg Hx    Heart disease Neg Hx    Hyperlipidemia Neg Hx    Hypertension Neg Hx    Stroke Neg Hx     Social History Social History   Tobacco Use   Smoking status: Former    Current packs/day: 0.00    Average packs/day: 0.3 packs/day for 2.0 years (0.5 ttl pk-yrs)    Types: Cigarettes    Start date: 02/18/2017    Quit date: 02/19/2019    Years since quitting: 4.8   Smokeless tobacco: Never  Vaping Use   Vaping status: Never Used  Substance Use Topics   Alcohol use: Yes    Alcohol/week: 1.0 standard drink of alcohol    Types: 1 Standard drinks or equivalent per week    Comment: occasional   Drug use: No     Allergies  Patient has no known allergies.   Review of Systems Review of Systems  Skin:        Puncture wound right foot      Physical Exam Triage Vital Signs ED Triage Vitals  Encounter Vitals Group     BP 01/04/24 1102 113/76     Girls Systolic BP Percentile --      Girls Diastolic BP Percentile --      Boys Systolic BP Percentile --      Boys Diastolic BP Percentile --      Pulse Rate 01/04/24 1102 84     Resp 01/04/24 1102 18     Temp 01/04/24 1102 98.2 F (36.8 C)     Temp Source 01/04/24 1102 Oral     SpO2 01/04/24 1102 97 %     Weight --      Height --      Head Circumference --      Peak Flow --      Pain Score 01/04/24 1101 5     Pain Loc --      Pain Education --      Exclude from Growth Chart --    No data found.  Updated Vital Signs BP 113/76 (BP Location: Left Arm)   Pulse 84   Temp 98.2 F (36.8 C) (Oral)   Resp 18   SpO2 97%   Visual  Acuity Right Eye Distance:   Left Eye Distance:   Bilateral Distance:    Right Eye Near:   Left Eye Near:    Bilateral Near:     Physical Exam Vitals and nursing note reviewed.  Constitutional:      General: He is not in acute distress.    Appearance: Normal appearance. He is not ill-appearing.  HENT:     Head: Normocephalic and atraumatic.  Eyes:     Pupils: Pupils are equal, round, and reactive to light.  Cardiovascular:     Rate and Rhythm: Normal rate.  Pulmonary:     Effort: Pulmonary effort is normal.  Musculoskeletal:       Feet:  Feet:     Comments: There is a single puncture wound to the mid plantar aspect of the right foot.  No active drainage or bleeding.  Area is mildly swollen with minimal erythema and tender to palpation. Skin:    General: Skin is warm and dry.  Neurological:     General: No focal deficit present.     Mental Status: He is alert and oriented to person, place, and time.  Psychiatric:        Mood and Affect: Mood normal.        Behavior: Behavior normal.      UC Treatments / Results  Labs (all labs ordered are listed, but only abnormal results are displayed) Labs Reviewed - No data to display  Comprehensive metabolic panel Order: [id number]  Status: Final result     Next appt: 08/27/2024 at 11:15 AM in Family Medicine (Ludie Gent, PA-C)     Dx: Encounter for health maintenance exam...   Test Result Released: Yes (seen)     Messages: Seen   1 Result Note     1 Patient Communication     View Follow-Up Encounter          Component Ref Range & Units (hover) 4 mo ago 1 yr ago 3 yr ago 4 yr ago 5 yr ago 6 yr ago 8 yr ago  Glucose 89 94 86  R 94 R 94 R 78 R, CM 64 Low  R  BUN 15 17 15 18 8 14 15  R  Creatinine, Ser 1.22 1.14 1.16 1.22 1.17 1.13 1.04 R  eGFR 78 86 85      BUN/Creatinine Ratio 12 15 13 15 7  Low  12   Sodium 139 138 140 148 High  136 141 138 R  Potassium 3.9 4.2 4.5 4.5 4.2 4.8 CM 4.0 R  Chloride 101 99 101 107  High  100 103 102 R  CO2 20 23 21 25 28 19  Low  26 R  Calcium 9.5 10.0 9.7 9.6 9.3 9.5 9.5 R  Total Protein 7.8 8.2 8.2 7.6  8.0   Albumin 4.6 5.1 5.1 High  R 4.8 R  4.9 R   Globulin, Total 3.2 3.1 3.1 2.8  3.1   Bilirubin Total 0.4 0.4 0.6 0.5  0.5   Alkaline Phosphatase 62 58 59 71 R  54 R   AST 29 28 16 19  18    ALT 33 39 18 16  24    Resulting Agency [facility]         Narrative Performed by: HOYT Performed at:  [address] - [facility] [address] [place], [place]  [id number] Lab Director: Frankey Sas MD, Phone:  [phone]  Specimen Collected: 08/21/23 12:08 Last Resulted: 08/22/23 02:35    EKG   Radiology DG Foot Complete Right Result Date: 01/04/2024 CLINICAL DATA:  stepped on nail yesteday EXAM: RIGHT FOOT COMPLETE - 3+ VIEW COMPARISON:  None Available. FINDINGS: There is no evidence of fracture or dislocation. There is no evidence of arthropathy or other focal bone abnormality. No radiodense foreign body. Soft tissues are unremarkable. IMPRESSION: Negative. Electronically Signed   By: JONETTA Faes M.D.   On: 01/04/2024 11:32    Procedures Procedures (including critical care time)  Medications Ordered in UC Medications - No data to display  Initial Impression / Assessment and Plan / UC Course  I have reviewed the triage vital signs and the nursing notes.  Pertinent labs & imaging results that were available during my care of the patient were reviewed by me and considered in my medical decision making (see chart for details).     Reviewed exam and symptoms with patient.  No red flags.  X-ray negative for foreign body.  Patient is up-to-date on his tetanus.  Wound was soaked/cleansed by nursing staff nonadherent dressing applied.  Given puncture wound did go through his shoe will start Levaquin daily for 5 days, side effect profile reviewed.  Advise OTC probiotic while taking antibiotic.  Discussed wound care and  signs and symptoms of infection.  Patient to follow-up with PCP if symptoms do not improve.  ER precautions reviewed. Final Clinical Impressions(s) / UC Diagnoses   Final diagnoses:  Puncture wound of right foot, initial encounter     Discharge Instructions      Keep the wound clean and dry.  Change dressing daily or as needed if it becomes soiled or saturated.  Start Levaquin antibiotic daily for 5 days.  If you start to develop achy joints or muscles please call the clinic ASAP.  Take a probiotic over-the-counter to help prevent diarrhea related to the antibiotic.  Lots of rest and fluids.  Please follow-up with your PCP if your symptoms or not improving.  Please go to the ER for any worsening symptoms.  I hope you feel better soon!  ED Prescriptions     Medication Sig Dispense Auth. Provider   levofloxacin (LEVAQUIN) 750 MG tablet Take 1 tablet (750 mg total) by mouth daily. 5 tablet Jenai Scaletta, Jodi R, NP      PDMP not reviewed this encounter.   Loreda Myla SAUNDERS, NP 01/04/24 1142    Loreda Myla SAUNDERS, NP 01/04/24 [phone]

## 2024-01-04 NOTE — ED Notes (Signed)
 Right Foot soaking per providers orders to cleanse wound.

## 2024-05-27 ENCOUNTER — Ambulatory Visit: Admitting: Family Medicine

## 2024-05-27 VITALS — BP 112/70 | HR 94 | Wt 158.2 lb

## 2024-05-27 DIAGNOSIS — R350 Frequency of micturition: Secondary | ICD-10-CM | POA: Diagnosis not present

## 2024-05-27 DIAGNOSIS — E860 Dehydration: Secondary | ICD-10-CM

## 2024-05-27 LAB — POCT GLYCOSYLATED HEMOGLOBIN (HGB A1C): Hemoglobin A1C: 5.4 % (ref 4.0–5.6)

## 2024-05-27 NOTE — Progress Notes (Signed)
° °  Name: Luis Watson   Date of Visit: 05/27/2024   Date of last visit with me: Visit date not found   CHIEF COMPLAINT:  Chief Complaint  Patient presents with   other    Feeling dehydrated, maybe sugar related, lips dry and dizzy sometimes, drinks a lot of water daily, 7-8 bottles of water,        HPI:  Discussed the use of AI scribe software for clinical note transcription with the patient, who gave verbal consent to proceed.  History of Present Illness   Luis Watson is a 38 year old male who presents with persistent thirst and increased urination.  He has been experiencing persistent thirst and increased urination for the past three days. Despite consuming large amounts of water, his symptoms have not improved. He also notes that his legs are becoming dry.  He attributes the increased urination to his increased water intake. He used to consume protein drinks and Gatorade when going to the gym but stopped drinking Gatorade two days ago. Since then, he has been drinking only water.  No family history of diabetes. He is originally from [place].  He feels tired and thirsty all the time, with increased urination. No current consumption of Gatorade or similar drinks.         OBJECTIVE:       08/21/2023   11:36 AM  Depression screen PHQ 2/9  Decreased Interest 0  Down, Depressed, Hopeless 0  PHQ - 2 Score 0     BP Readings from Last 3 Encounters:  05/27/24 112/70  01/04/24 113/76  08/21/23 120/80    BP 112/70   Pulse 94   Wt 158 lb 3.2 oz (71.8 kg)   BMI 26.74 kg/m    Physical Exam          Physical Exam Constitutional:      Appearance: Normal appearance.  Neurological:     General: No focal deficit present.     Mental Status: He is alert and oriented to person, place, and time. Mental status is at baseline.     ASSESSMENT/PLAN:   Assessment & Plan Dehydration  Increased urinary frequency    Assessment and Plan    Dehydration with thirst Suspected  low sodium due to excessive water intake without electrolyte replacement. Normal blood sugar levels. - Ordered blood tests for electrolytes, including sodium and potassium.  - Will do BMP, suspect over hydration with water. Advise increasing sodium intake via electrolyte drinks and  - Will f/u pending labs.         Jalayla Chrismer A. Vita MD [facility]

## 2024-05-28 ENCOUNTER — Ambulatory Visit: Payer: Self-pay | Admitting: Family Medicine

## 2024-05-28 LAB — BASIC METABOLIC PANEL WITH GFR
BUN/Creatinine Ratio: 14 (ref 9–20)
BUN: 15 mg/dL (ref 6–20)
CO2: 18 mmol/L — ABNORMAL LOW (ref 20–29)
Calcium: 9.3 mg/dL (ref 8.7–10.2)
Chloride: 103 mmol/L (ref 96–106)
Creatinine, Ser: 1.07 mg/dL (ref 0.76–1.27)
Glucose: 107 mg/dL — ABNORMAL HIGH (ref 70–99)
Potassium: 4 mmol/L (ref 3.5–5.2)
Sodium: 142 mmol/L (ref 134–144)
eGFR: 92 mL/min/1.73 (ref >59)

## 2024-08-27 ENCOUNTER — Encounter: Payer: Self-pay | Admitting: Medical
